# Patient Record
Sex: Male | Born: 1935 | Race: White | Hispanic: No | Smoking: Former smoker
Health system: Southern US, Community
[De-identification: ages and names within clinical notes are randomized; demographics above are authoritative.]

## PROBLEM LIST (undated history)

## (undated) DIAGNOSIS — I2510 Atherosclerotic heart disease of native coronary artery without angina pectoris: Secondary | ICD-10-CM

## (undated) DIAGNOSIS — I219 Acute myocardial infarction, unspecified: Secondary | ICD-10-CM

## (undated) DIAGNOSIS — K861 Other chronic pancreatitis: Secondary | ICD-10-CM

## (undated) DIAGNOSIS — R0602 Shortness of breath: Secondary | ICD-10-CM

## (undated) DIAGNOSIS — I10 Essential (primary) hypertension: Secondary | ICD-10-CM

## (undated) HISTORY — PX: ABLATION OF DYSRHYTHMIC FOCUS: SHX254

## (undated) HISTORY — DX: Other chronic pancreatitis: K86.1

## (undated) HISTORY — PX: BACK SURGERY: SHX140

---

## 2001-02-26 ENCOUNTER — Encounter: Admission: RE | Admit: 2001-02-26 | Discharge: 2001-05-27 | Payer: Self-pay | Admitting: Cardiology

## 2003-03-19 HISTORY — PX: CORONARY ARTERY BYPASS GRAFT: SHX141

## 2003-07-25 ENCOUNTER — Inpatient Hospital Stay (HOSPITAL_COMMUNITY): Admission: EM | Admit: 2003-07-25 | Discharge: 2003-08-01 | Payer: Self-pay | Admitting: Emergency Medicine

## 2003-08-12 ENCOUNTER — Encounter: Admission: RE | Admit: 2003-08-12 | Discharge: 2003-08-12 | Payer: Self-pay | Admitting: Cardiothoracic Surgery

## 2003-09-12 ENCOUNTER — Encounter (HOSPITAL_COMMUNITY): Admission: RE | Admit: 2003-09-12 | Discharge: 2003-12-11 | Payer: Self-pay | Admitting: Cardiology

## 2003-11-24 ENCOUNTER — Encounter: Payer: Self-pay | Admitting: Gastroenterology

## 2004-01-02 ENCOUNTER — Inpatient Hospital Stay (HOSPITAL_COMMUNITY): Admission: EM | Admit: 2004-01-02 | Discharge: 2004-01-09 | Payer: Self-pay | Admitting: Emergency Medicine

## 2004-04-30 ENCOUNTER — Encounter (HOSPITAL_COMMUNITY): Admission: RE | Admit: 2004-04-30 | Discharge: 2004-07-29 | Payer: Self-pay | Admitting: Cardiology

## 2004-09-05 ENCOUNTER — Encounter: Admission: RE | Admit: 2004-09-05 | Discharge: 2004-09-05 | Payer: Self-pay | Admitting: Internal Medicine

## 2007-04-03 ENCOUNTER — Ambulatory Visit: Payer: Self-pay | Admitting: Internal Medicine

## 2007-04-24 ENCOUNTER — Inpatient Hospital Stay (HOSPITAL_COMMUNITY): Admission: EM | Admit: 2007-04-24 | Discharge: 2007-04-30 | Payer: Self-pay | Admitting: Emergency Medicine

## 2007-05-11 ENCOUNTER — Ambulatory Visit (HOSPITAL_COMMUNITY): Admission: RE | Admit: 2007-05-11 | Discharge: 2007-05-12 | Payer: Self-pay | Admitting: Internal Medicine

## 2007-05-11 ENCOUNTER — Encounter: Payer: Self-pay | Admitting: Internal Medicine

## 2007-05-11 ENCOUNTER — Ambulatory Visit: Payer: Self-pay | Admitting: Internal Medicine

## 2007-06-18 ENCOUNTER — Ambulatory Visit: Payer: Self-pay | Admitting: Internal Medicine

## 2007-06-30 ENCOUNTER — Encounter: Admission: RE | Admit: 2007-06-30 | Discharge: 2007-08-04 | Payer: Self-pay | Admitting: Orthopedic Surgery

## 2008-02-06 ENCOUNTER — Encounter: Payer: Self-pay | Admitting: Emergency Medicine

## 2008-02-06 ENCOUNTER — Observation Stay (HOSPITAL_COMMUNITY): Admission: EM | Admit: 2008-02-06 | Discharge: 2008-02-09 | Payer: Self-pay | Admitting: Cardiovascular Disease

## 2008-08-22 ENCOUNTER — Inpatient Hospital Stay (HOSPITAL_COMMUNITY): Admission: EM | Admit: 2008-08-22 | Discharge: 2008-08-26 | Payer: Self-pay | Admitting: Emergency Medicine

## 2008-08-22 ENCOUNTER — Encounter (INDEPENDENT_AMBULATORY_CARE_PROVIDER_SITE_OTHER): Payer: Self-pay | Admitting: *Deleted

## 2008-09-04 DIAGNOSIS — K869 Disease of pancreas, unspecified: Secondary | ICD-10-CM | POA: Insufficient documentation

## 2008-09-12 ENCOUNTER — Ambulatory Visit (HOSPITAL_COMMUNITY): Admission: RE | Admit: 2008-09-12 | Discharge: 2008-09-12 | Payer: Self-pay | Admitting: General Surgery

## 2008-10-24 ENCOUNTER — Encounter: Payer: Self-pay | Admitting: Gastroenterology

## 2008-11-01 ENCOUNTER — Ambulatory Visit (HOSPITAL_COMMUNITY): Admission: RE | Admit: 2008-11-01 | Discharge: 2008-11-01 | Payer: Self-pay | Admitting: General Surgery

## 2008-11-01 ENCOUNTER — Encounter (INDEPENDENT_AMBULATORY_CARE_PROVIDER_SITE_OTHER): Payer: Self-pay | Admitting: *Deleted

## 2008-11-01 ENCOUNTER — Encounter (INDEPENDENT_AMBULATORY_CARE_PROVIDER_SITE_OTHER): Payer: Self-pay | Admitting: General Surgery

## 2008-11-29 DIAGNOSIS — I10 Essential (primary) hypertension: Secondary | ICD-10-CM | POA: Insufficient documentation

## 2008-11-29 DIAGNOSIS — K449 Diaphragmatic hernia without obstruction or gangrene: Secondary | ICD-10-CM | POA: Insufficient documentation

## 2008-11-29 DIAGNOSIS — K8020 Calculus of gallbladder without cholecystitis without obstruction: Secondary | ICD-10-CM | POA: Insufficient documentation

## 2008-11-29 DIAGNOSIS — I2510 Atherosclerotic heart disease of native coronary artery without angina pectoris: Secondary | ICD-10-CM | POA: Insufficient documentation

## 2008-11-29 DIAGNOSIS — E785 Hyperlipidemia, unspecified: Secondary | ICD-10-CM

## 2008-12-05 ENCOUNTER — Ambulatory Visit: Payer: Self-pay | Admitting: Gastroenterology

## 2008-12-06 ENCOUNTER — Encounter: Payer: Self-pay | Admitting: Gastroenterology

## 2008-12-06 ENCOUNTER — Ambulatory Visit: Payer: Self-pay | Admitting: Gastroenterology

## 2008-12-08 ENCOUNTER — Encounter: Payer: Self-pay | Admitting: Gastroenterology

## 2009-01-16 ENCOUNTER — Emergency Department (HOSPITAL_COMMUNITY): Admission: EM | Admit: 2009-01-16 | Discharge: 2009-01-16 | Payer: Self-pay | Admitting: Emergency Medicine

## 2010-06-23 LAB — TYPE AND SCREEN
ABO/RH(D): O POS
Antibody Screen: NEGATIVE

## 2010-06-23 LAB — CBC
HCT: 39.8 % (ref 39.0–52.0)
Hemoglobin: 13.2 g/dL (ref 13.0–17.0)
RBC: 4.08 MIL/uL — ABNORMAL LOW (ref 4.22–5.81)

## 2010-06-23 LAB — HEPATIC FUNCTION PANEL
ALT: 16 U/L (ref 0–53)
AST: 28 U/L (ref 0–37)
Albumin: 4.2 g/dL (ref 3.5–5.2)
Alkaline Phosphatase: 68 U/L (ref 39–117)
Bilirubin, Direct: 0.2 mg/dL (ref 0.0–0.3)
Total Bilirubin: 1.2 mg/dL (ref 0.3–1.2)

## 2010-06-23 LAB — BASIC METABOLIC PANEL
Calcium: 9.8 mg/dL (ref 8.4–10.5)
GFR calc Af Amer: >60 mL/min (ref >60)
GFR calc non Af Amer: >60 mL/min (ref >60)
Glucose, Bld: 94 mg/dL (ref 70–99)
Potassium: 4.2 mEq/L (ref 3.5–5.1)
Sodium: 141 mEq/L (ref 135–145)

## 2010-06-25 LAB — COMPREHENSIVE METABOLIC PANEL
ALT: 18 U/L (ref 0–53)
ALT: 22 U/L (ref 0–53)
AST: 23 U/L (ref 0–37)
AST: 34 U/L (ref 0–37)
Albumin: 3 g/dL — ABNORMAL LOW (ref 3.5–5.2)
Alkaline Phosphatase: 101 U/L (ref 39–117)
Alkaline Phosphatase: 123 U/L — ABNORMAL HIGH (ref 39–117)
Alkaline Phosphatase: 89 U/L (ref 39–117)
BUN: 27 mg/dL — ABNORMAL HIGH (ref 6–23)
BUN: 31 mg/dL — ABNORMAL HIGH (ref 6–23)
CO2: 26 mEq/L (ref 19–32)
CO2: 32 mEq/L (ref 19–32)
Calcium: 8.5 mg/dL (ref 8.4–10.5)
Chloride: 100 mEq/L (ref 96–112)
Chloride: 102 mEq/L (ref 96–112)
GFR calc Af Amer: >60 mL/min (ref >60)
GFR calc Af Amer: >60 mL/min (ref >60)
GFR calc non Af Amer: >60 mL/min (ref >60)
GFR calc non Af Amer: >60 mL/min (ref >60)
Glucose, Bld: 113 mg/dL — ABNORMAL HIGH (ref 70–99)
Glucose, Bld: 130 mg/dL — ABNORMAL HIGH (ref 70–99)
Potassium: 3 mEq/L — ABNORMAL LOW (ref 3.5–5.1)
Potassium: 3.9 mEq/L (ref 3.5–5.1)
Potassium: 4.5 mEq/L (ref 3.5–5.1)
Sodium: 136 mEq/L (ref 135–145)
Sodium: 137 mEq/L (ref 135–145)
Total Bilirubin: 2.2 mg/dL — ABNORMAL HIGH (ref 0.3–1.2)
Total Bilirubin: 2.3 mg/dL — ABNORMAL HIGH (ref 0.3–1.2)
Total Protein: 6.8 g/dL (ref 6.0–8.3)

## 2010-06-25 LAB — POCT I-STAT, CHEM 8
BUN: 37 mg/dL — ABNORMAL HIGH (ref 6–23)
Calcium, Ion: 1.11 mmol/L — ABNORMAL LOW (ref 1.12–1.32)
Chloride: 100 mEq/L (ref 96–112)
Creatinine, Ser: 1.4 mg/dL (ref 0.4–1.5)
Glucose, Bld: 128 mg/dL — ABNORMAL HIGH (ref 70–99)
HCT: 42 % (ref 39.0–52.0)
Hemoglobin: 14.3 g/dL (ref 13.0–17.0)
Potassium: 4.4 mEq/L (ref 3.5–5.1)
Sodium: 137 mEq/L (ref 135–145)
TCO2: 30 mmol/L (ref 0–100)

## 2010-06-25 LAB — POCT CARDIAC MARKERS
CKMB, poc: <1 ng/mL — ABNORMAL LOW (ref 1.0–8.0)
Myoglobin, poc: 92.4 ng/mL (ref 12–200)
Troponin i, poc: <0.05 ng/mL (ref 0.00–0.09)

## 2010-06-25 LAB — URINE MICROSCOPIC-ADD ON

## 2010-06-25 LAB — URINALYSIS, ROUTINE W REFLEX MICROSCOPIC
Glucose, UA: NEGATIVE mg/dL
Hgb urine dipstick: NEGATIVE
Ketones, ur: 15 mg/dL — AB
Ketones, ur: NEGATIVE mg/dL
Leukocytes, UA: NEGATIVE
Nitrite: NEGATIVE
Nitrite: POSITIVE — AB
Protein, ur: 100 mg/dL — AB
Protein, ur: 100 mg/dL — AB
Specific Gravity, Urine: 1.027 (ref 1.005–1.030)
Urobilinogen, UA: 1 mg/dL (ref 0.0–1.0)
Urobilinogen, UA: 4 mg/dL — ABNORMAL HIGH (ref 0.0–1.0)
pH: 5.5 (ref 5.0–8.0)

## 2010-06-25 LAB — HEMOGLOBIN AND HEMATOCRIT, BLOOD: HCT: 35.4 % — ABNORMAL LOW (ref 39.0–52.0)

## 2010-06-25 LAB — APTT
aPTT: 36 seconds (ref 24–37)
aPTT: 34 seconds (ref 24–37)

## 2010-06-25 LAB — BASIC METABOLIC PANEL
BUN: 26 mg/dL — ABNORMAL HIGH (ref 6–23)
Calcium: 9.6 mg/dL (ref 8.4–10.5)
Chloride: 98 mEq/L (ref 96–112)
Creatinine, Ser: 1.24 mg/dL (ref 0.4–1.5)
GFR calc Af Amer: >60 mL/min (ref >60)
GFR calc non Af Amer: 53 mL/min — ABNORMAL LOW (ref >60)
GFR calc non Af Amer: 57 mL/min — ABNORMAL LOW (ref >60)
Glucose, Bld: 98 mg/dL (ref 70–99)
Potassium: 4.2 mEq/L (ref 3.5–5.1)
Sodium: 142 mEq/L (ref 135–145)

## 2010-06-25 LAB — CBC
HCT: 40.1 % (ref 39.0–52.0)
HCT: 40.4 % (ref 39.0–52.0)
Hemoglobin: 13.8 g/dL (ref 13.0–17.0)
MCHC: 34 g/dL (ref 30.0–36.0)
MCV: 98.4 fL (ref 78.0–100.0)
Platelets: 118 10*3/uL — ABNORMAL LOW (ref 150–400)
RBC: 4.14 MIL/uL — ABNORMAL LOW (ref 4.22–5.81)
RDW: 14 % (ref 11.5–15.5)
RDW: 14.4 % (ref 11.5–15.5)
WBC: 14.6 10*3/uL — ABNORMAL HIGH (ref 4.0–10.5)
WBC: 16.4 10*3/uL — ABNORMAL HIGH (ref 4.0–10.5)

## 2010-06-25 LAB — DIFFERENTIAL
Basophils Absolute: 0 10*3/uL (ref 0.0–0.1)
Basophils Absolute: 0.1 10*3/uL (ref 0.0–0.1)
Basophils Relative: 0 % (ref 0–1)
Eosinophils Absolute: 0 10*3/uL (ref 0.0–0.7)
Eosinophils Relative: 0 % (ref 0–5)
Neutro Abs: 13.7 10*3/uL — ABNORMAL HIGH (ref 1.7–7.7)
Neutrophils Relative %: 84 % — ABNORMAL HIGH (ref 43–77)

## 2010-06-25 LAB — BILIRUBIN, FRACTIONATED(TOT/DIR/INDIR)
Bilirubin, Direct: 2.2 mg/dL — ABNORMAL HIGH (ref 0.0–0.3)
Indirect Bilirubin: 2.3 mg/dL — ABNORMAL HIGH (ref 0.3–0.9)
Total Bilirubin: 4.5 mg/dL — ABNORMAL HIGH (ref 0.3–1.2)

## 2010-06-25 LAB — MAGNESIUM: Magnesium: 2.2 mg/dL (ref 1.5–2.5)

## 2010-06-25 LAB — PROTIME-INR
INR: 1.2 (ref 0.00–1.49)
INR: 1.2 (ref 0.00–1.49)
Prothrombin Time: 15.1 seconds (ref 11.6–15.2)
Prothrombin Time: 15.7 s — ABNORMAL HIGH (ref 11.6–15.2)

## 2010-06-25 LAB — CULTURE, BLOOD (ROUTINE X 2): Culture: NO GROWTH

## 2010-06-25 LAB — HEPATIC FUNCTION PANEL
AST: 25 U/L (ref 0–37)
Albumin: 3.6 g/dL (ref 3.5–5.2)
Bilirubin, Direct: 0.3 mg/dL (ref 0.0–0.3)
Total Protein: 7 g/dL (ref 6.0–8.3)

## 2010-06-25 LAB — LIPID PANEL
Cholesterol: 114 mg/dL (ref 0–200)
Total CHOL/HDL Ratio: 2.9 RATIO
VLDL: 9 mg/dL (ref 0–40)

## 2010-06-25 LAB — LIPASE, BLOOD
Lipase: 119 U/L — ABNORMAL HIGH (ref 11–59)
Lipase: 36 U/L (ref 11–59)

## 2010-06-25 LAB — ABO/RH: ABO/RH(D): O POS

## 2010-06-25 LAB — BILIRUBIN, TOTAL: Total Bilirubin: 4.1 mg/dL — ABNORMAL HIGH (ref 0.3–1.2)

## 2010-06-25 LAB — BILIRUBIN, DIRECT: Bilirubin, Direct: 1.7 mg/dL — ABNORMAL HIGH (ref 0.0–0.3)

## 2010-07-31 NOTE — Op Note (Signed)
NAME:  Craig Parks, Craig Parks NO.:  [id number]   MEDICAL RECORD NO.:  [id number]          PATIENT TYPE:  AMB   LOCATION:  DAY                          FACILITY:  [facility]   PHYSICIAN:  Almond Lint, MD       DATE OF BIRTH:  15-Dec-1929   DATE OF PROCEDURE:  11/01/2008  DATE OF DISCHARGE:                               OPERATIVE REPORT   PREOPERATIVE DIAGNOSIS:  Gallstone pancreatitis.   POSTOPERATIVE DIAGNOSIS:  Gallstone pancreatitis.   PROCEDURE PERFORMED:  Laparoscopic cholecystectomy.   SURGEON:  Almond Lint, MD.   ANESTHESIA:  General and local.   FINDINGS:  Slightly inflamed gallbladder.   SPECIMEN:  Gallbladder to Pathology.   ESTIMATED BLOOD LOSS:  Minimal.   COMPLICATIONS:  None known.   PROCEDURE:  Craig Parks was identified in the holding area and taken to  the operating room where he was placed supine on the operating room  table.  General anesthesia was induced.  His abdomen was prepped and  draped in a sterile fashion.  A timeout was performed according to the  surgical safety checklist.  When all was correct, we continued.  The  infraumbilical skin was anesthetized with a mixture 1% lidocaine plain  and 0.25% Marcaine with epinephrine.  A curvilinear transverse incision  was made with a #11 blade below the umbilicus.  A Kelly clamp was used  to spread the subcutaneous tissues.  Two Kochers were used to elevate  the fascia in the midline.  The #11 blade was used to enter the fascia  in the midline.  A Tresa Endo was advanced into the peritoneal cavity and  dilated the fascial incision.  The fascial incision had a 0 Vicryl  pursestring suture placed around it.  The [facility] trocar was advanced  into the abdomen and secured to the abdominal wall with the tails of the  suture.  A pneumoperitoneum was achieved to a pressure of 15 mmHg.  The  patient was placed into reverse Trendelenburg position and rotated to  the left.  The epigastrium was identified and an  11-mm port was placed  under direct visualization after administration of local anesthetic.  Two 5-mm ports were placed under direct visualization in the right upper  quadrant.  A locking grasper was used to elevate the fundus of the  gallbladder toward the head.  An additional locking grasper was used to  retract the infundibulum and the gallbladder laterally.  A [place]  dissector was used to start to skeletonize the cystic duct and cystic  artery.  The artery was lying over the top of the cystic duct.  The hook  cautery was used to open up the peritoneum to facilitate the  skeletonization.  The artery was taken first after it was skeletonized  with 2 clips on the patient side and 1 on the specimen side.  This was  then transected.  The cystic duct was skeletonized.  Higher up in the  gallbladder, it began to taper down and so this was taken where it  tapered.  The cystic duct then had 3 clamps placed on the patient's  side  and 1 on the specimen side.  This was then transected.  The hook cautery  was then used to take the gallbladder off of the gallbladder fossa.  There was one area that appeared to be a small draining vein from the  gallbladder and this was clipped higher up.  The gallbladder was  completely removed and then placed into an EndoCatch bag and removed  through the umbilicus.  The gallbladder fossa was reexamined and there  was noted to be no bleeding and no bile leakage.  The area was irrigated  both above and below of the liver.  Again, the gallbladder fossa was  reinspected and there was no bleeding or bile leakage.  The 11-mm and 5-  mm ports were removed under direct visualization and there was no  evidence of bleeding.  The pneumoperitoneum was allowed to completely  evacuate through the umbilical port.  The trocar was then removed and  then the fascia was closed with a pursestring suture.  This was palpated  and there was no residual fascial defect identified.  The  skin of all of  the incisions was then closed with Monocryl 4-0 in a subcuticular  fashion.  The skin of all these was cleaned, dried, and dressed with  Dermabond.  The patient was awakened from anesthesia and taken to the  PACU in stable condition.      Almond Lint, MD  Electronically Signed     FB/MEDQ  D:  11/01/2008  T:  11/01/2008  Job:  [id number]

## 2010-07-31 NOTE — Op Note (Signed)
NAME:  CASSIUS, CULLINANE NO.:  [id number]   MEDICAL RECORD NO.:  [id number]          PATIENT TYPE:  INP   LOCATION:  5038                         FACILITY:  [facility]   PHYSICIAN:  Alvy Beal, MD    DATE OF BIRTH:  1929/09/29   DATE OF PROCEDURE:  04/25/2007  DATE OF DISCHARGE:                               OPERATIVE REPORT   CONTINUATION OF OPERATION:  Once I had an adequate decompression  completed from L2 to L5, I could easily pass the [place] 4 down the  lateral gutter.  I was also able take a Public house manager (hockey stick)  and pass it out the L2, L3 and L4 neural foramen bilaterally without any  significant undue tension.   At this point with the decompression complete, I took intraoperative x-  rays to confirm that I had adequately spanned the area of stenosis with  my decompression.  Once I was satisfied with the decompression, I  irrigated the wound copiously normal saline.  I then rechecked via  Valsalva  to ensure that there was no ongoing leak.  At this point I  then placed a drain into the deep wound and then placed FloSeal to help  maintain hemostasis.  I did use bipolar cautery to cauterize the  epidural veins and bleeders that I could visualize.  I then closed the  deep fascia with interrupted #1 Vicryl sutures, 2-0 Vicryl sutures for  the subcutaneous and staples for the skin.  A thick dry dressing was  applied.  He was extubated, transferred to PACU without incident.  The  end of the case all needle, sponge counts were correct.   FIRST ASSISTANT:  Crissie Reese, PA      Alvy Beal, MD  Electronically Signed     DDB/MEDQ  D:  04/25/2007  T:  04/27/2007  Job:  [id number]

## 2010-07-31 NOTE — Op Note (Signed)
NAME:  DAISON, Craig Parks NO.:  [id number]   MEDICAL RECORD NO.:  [id number]          PATIENT TYPE:  OIB   LOCATION:  3713                         FACILITY:  [facility]   PHYSICIAN:  Doylene Canning. Ladona Ridgel, MD    DATE OF BIRTH:  03-25-1929   DATE OF PROCEDURE:  DATE OF DISCHARGE:                               OPERATIVE REPORT   PROCEDURE PERFORMED:  Electrophysiologic study and radiofrequency  catheter ablation of atrial flutter.   INTRODUCTION:  The patient is a 77 year old male with a history of  typical palpitations and documented atrial flutter.  He also has  coronary disease, status post bypass surgery, and is now referred for  electrophysiologic study and catheter ablation.  Of note, the patient's  INR was subtherapeutic and so he has undergone TEE, which demonstrated  no left atrial appendage thrombus.  He is now presents for ablation.   PROCEDURE:  After informed was obtained, the patient was taken to the  diagnostic EP lab in a fasting state.  After the usual preparation and  draping, intravenous fentanyl and midazolam were given for sedation.  A  6-French hexapolar catheter was inserted percutaneously in the right  jugular vein and advanced to the coronary sinus.  A 7-French 20-pole  halo catheter was inserted percutaneously in the right femoral vein and  advanced to right atrium.  A 5-French quadripolar catheter was inserted  percutaneously in the right femoral vein and advanced to the His bundle  region.  These catheters were inserted under fluoroscopic guidance.  Mapping was carried out.  The atrial flutter was typical  counterclockwise tricuspid annular reentrant atrial flutter and the  atrial flutter isthmus was much larger than usual, being anteriorly  displaced.  A total of 14 RF energy applications were subsequently  delivered, resulting in the termination of flutter and restoration of  sinus rhythm.  The patient was observed and during this time he had  rapid ventricular pacing, demonstrating VA dissociation.  Programmed  ventricular stimulation demonstrated VA dissociation.  Rapid atrial  pacing was carried out from the coronary sinus in the high right atrium  and stepwise decreased to 620 msec, where AV Wenckebach was observed.  During rapid atrial pacing the PR interval was less than the RR interval  and there was no inducible SVT.  Programmed atrial stimulation was  carried out from the coronary sinus in the high right atrium at base  drive cycle length of 161 msec and stepwise decreased down to 610 msec,  where the AV node ERP was observed.  During programmed atrial motion  there were no AH jumps, no echo beats, no inducible SVT.  At this point  the catheters were removed, hemostasis was assured and the patient was  returned to his room in satisfactory condition.   COMPLICATIONS:  There were no immediate procedure complications.   RESULTS:  A.  Baseline ECG:  Baseline ECG demonstrates atrial flutter  with a controlled ventricular response.   B.  Baseline intervals:  The atrial flutter cycle length was 270 msec.  The HV interval was 45 msec.  QRS duration was 92  msec.   C.  Rapid ventricular pacing:  Rapid ventricular pacing was carried out  from the RV apex, demonstrating VA dissociation at 600 msec.   D.  Programmed ventricular stimulation:  Programmed ventricular  stimulation was carried out from the RV apex demonstrating VA  dissociation at 600 msec.   E.  Programmed atrial stimulation:  Programmed atrial stimulation was  carried out from the coronary sinus in the high right atrium at a base  drive cycle of 562 msec.  The S1-S2 interval was stepwise decreased down  to 610 msec, the where AV node ERP was observed.  During programmed  atrial stimulation there were no AH jumps, no echo beats.   F.  Rapid atrial pacing:  Rapid atrial pacing was carried out from the  coronary sinus in the high right atrium demonstrating AV  Wenckebach at  620 msec.  During rapid atrial pacing there was no inducible SVT.   G.  Arrhythmias observed:  Atrial flutter.  Initiation:  Present at the  time of EP study.  Duration was sustained.  Cycle length:  270 msec.  Method of termination was with catheter ablation.   H.  Mapping:  Mapping of the atrial flutter isthmus demonstrated an  unusually large atrial flutter isthmus.   1. RF energy application:  A total of 14 RF energy applications were      delivered resulting in the termination of flutter and restoration      of sinus rhythm, creation of bidirectional block in the atrial      flutter isthmus.   CONCLUSION:  This study demonstrates successful electrophysiologic study  and radiofrequency catheter ablation of typical atrial flutter with a  total of 14 radiofrequency energy applications delivered, resulting  termination of flutter, restoration of sinus rhythm, and creation of  bidirectional block in the atrial flutter isthmus.      Doylene Canning. Ladona Ridgel, MD  Electronically Signed     GWT/MEDQ  D:  05/11/2007  T:  05/12/2007  Job:  [id number]   cc:   Cristy Hilts. Jacinto Halim, MD

## 2010-07-31 NOTE — Consult Note (Signed)
NAME:  Craig Parks, Craig Parks NO.:  [id number]   MEDICAL RECORD NO.:  [id number]          PATIENT TYPE:  INP   LOCATION:  5038                         FACILITY:  [facility]   PHYSICIAN:  Alvy Beal, MD    DATE OF BIRTH:  [birth year]   DATE OF CONSULTATION:  DATE OF DISCHARGE:                                 CONSULTATION   REASON FOR CONSULTATION:  Acute foot drop.   HISTORY:  Craig Parks is a very pleasant 77 year old very active gentleman  who on February 4 was lifting some heavy objects and felt a pop in his  low back. He noted immediate underlying back pain and severe left leg  pain and a foot drop.  Because of the ongoing increasing symptoms, he  presents to the emergency room on April 24, 2007, and was admitted.  My partner, Dr. Ranell Patrick, was initially consulted and he notified me and  now assuming care for his spine issues.   At this point, the patient's overall pain is slightly improved with the  morphine and Decadron, but he still has a significant foot drop.  He  states that 30+ years ago he had a lumbar decompression 4-5. 5-1, and he  has done exceptionally well and now only his big complaint recently is  just some mild back pain.  However, ever since February 4, he has had  horrific leg pain.  He has been unable to move the foot.   He denies any history of incontinence of bowel and bladder.   Past medical, surgical, family, social history includes chronic a fib on  adequate rate control, chronic anticoagulation with Coumadin, coronary  artery disease status post CABG, hypertension, hypercholesterolemia and  diabetes.  Medications and allergies and past medical history are  outlined in Dr. Ranell Patrick' admission H&P from April 24, 2007.  There have  been no changes, and I have reviewed the clinical exam.   PHYSICAL EXAMINATION:  GENERAL:  Pleasant gentleman appears his stated  age in no acute distress.  He is alert and oriented x3.  NEUROLOGICAL:  He has downgoing  toes on Babinski testing.  No clonus.  Symmetrical deep tendon reflexes, 1+ bilaterally.  Deep tendon reflexes  are 1+ symmetrical bilaterally.  He has absent strength to the EHL,  tibialis anterior (0/5 on the left and about a +4/5 gastrocnemius on the  left, right side 5/5 throughout).  By report, normal rectal tone, normal  perianal sensation.  He has had negative nerve root tension sign.  He  had 5/5 strength in the upper extremity.  Negative Hoffman's sign.  He  has significant back pain with attempts at forward flexion in the bed.   STUDIES:  He does have an MRI which demonstrates a disk/scar tissue at  L3-4 causing both central and lateral recess stenosis, left side appears  to be worse than the right.  There is some scar tissue at 4-5 and 5-1  and some mild stenosis, but does appear to be canal limiting.   ASSESSMENT:  At this point time, the patient's INR is 1.6 and is being  reversed.  I  do think a myelogram would be in order to ensure that there  is no other area of significant compression and it is difficult to  determine whether or not he has scar tissue or any other areas of  compression, and I do think that surgical decompression is warranted.  It is  just a question of the extent of that decompression.  His most recent  INR is 1.6, and this was after vitamin K was given.  At this point, I am  awaiting a repeat INR so we can safely perform the myelogram and have a  better idea of how extensive the decompression needs to be.      Alvy Beal, MD  Electronically Signed     DDB/MEDQ  D:  04/25/2007  T:  04/27/2007  Job:  [id number]   cc:   Lorre Nick, M.D.

## 2010-07-31 NOTE — Assessment & Plan Note (Signed)
[facility]                         ELECTROPHYSIOLOGY OFFICE NOTE   Craig Parks, Craig Parks                     MRN:          [id number]  DATE:06/18/2007                            DOB:          18-Oct-1929    Craig Parks returns today for follow-up.  He is a very pleasant 77-year-  old male with a history of coronary disease and a history of atrial  flutter status post catheter ablation of his atrial flutter which was  carried out approximately six weeks ago.  He returns today for follow-up  and has done well.  He notes that very soon after his ablation he  immediately felt better.  He denies chest pain.  He continues on his  warfarin.   His medications besides warfarin include finasteride 5 mg a day, iron  325 two tablets a week, eplerenone 25 daily, Zocor 40 a day, aspirin 325  a day, folate and carvedilol 6.25 twice daily.   On exam today, he is a pleasant, well-appearing, elderly man in no  distress.  Blood pressure was 130/75, pulse 75 and regular, respirations  were 18.  Weight was 201 pounds.  NECK:  Revealed no jugular venous distention.  LUNGS:  Were clear bilaterally to auscultation.  No wheezes, rales, or  rhonchi are present.  CARDIOVASCULAR EXAM:  Revealed an irregular rhythm with normal S1-S2.  ABDOMINAL EXAM:  Was soft and nontender.  EXTREMITIES:  Demonstrated trace peripheral edema bilaterally.   His EKG demonstrates sinus rhythm with occasional PACs.   IMPRESSION:  1. Symptomatic atrial flutter.  2. Status post ablation.  3. COUMADIN therapy secondary to #1.  4. Known coronary disease.   DISCUSSION:  Overall, Craig Parks is stable.  He is maintaining sinus  rhythm very nicely.  I have asked that we discontinue his Coumadin today  and start full-strength aspirin.  I will see him back on a p.r.n. basis.     Doylene Canning. Ladona Ridgel, MD  Electronically Signed    GWT/MedQ  DD: 06/18/2007  DT: 06/18/2007  Job #: [id number]   cc:   Cristy Hilts.  Jacinto Halim, MD

## 2010-07-31 NOTE — Discharge Summary (Signed)
NAME:  Craig Parks, Craig Parks NO.:  [id number]   MEDICAL RECORD NO.:  [id number]          PATIENT TYPE:  INP   LOCATION:  6714                         FACILITY:  [facility]   PHYSICIAN:  Altha Harm, MDDATE OF BIRTH:  1930-02-28   DATE OF ADMISSION:  08/22/2008  DATE OF DISCHARGE:  08/26/2008                               DISCHARGE SUMMARY   DISCHARGE DISPOSITION:  Home.   DISCHARGE DIAGNOSES:  1. Biliary pancreatitis.  2. Cholelithiasis.  3. Hyperbilirubinemia suspected medication-related.  4. Hypertension.  5. Coronary artery disease status post coronary artery bypass graft.  6. Dyslipidemia.   DISCHARGE MEDICATIONS INCLUDE THE FOLLOWING:  1. Percocet 5/325 one to two tablets p.o. q.4 h. p.r.n.  2. Zocor 40 mg p.o. daily.  3. Folic acid 800 mcg p.o. daily.  4. Imdur 60 mg p.o. daily.  5. Coreg 12.5 mg daily b.i.d.  6. Aspirin 81 mg p.o. daily.  7. Triazolam 0.25 mg p.o. q.h.s.   DISCONTINUED MEDICATIONS:  1. Niacin 500 mg p.o. daily.  2. Aspirin 325 mg p.o. q.p.m.   CONSULTANTS:  1. [facility], Dr. Donell Beers.  2. Gastroenterology, Dr. Bosie Clos.   PROCEDURES:  None.   DIAGNOSTIC STUDIES:  1. Ultrasound of the abdomen done on admission which shows gallstones      and gallbladder sludge, negative for biliary obstruction.  2. CT of the abdomen without contrast done on admission which shows      findings consistent with acute pancreatitis.  There are no obvious      complicating features without contrast.   IMPRESSION:  1. Cholelithiasis.  2. Small bilateral pleural effusions and bibasilar atelectasis.  3. Coronary artery calcifications.  4. Hiatal hernia.  5. Advanced atherosclerotic changes involving the aorta without focal      aneurysm.   STUDIES:  1. One-view chest x-ray done on the 8th which shows congestive heart      failure.  2. HIDA scan done on the 8th which shows patent cystic duct and common      bile duct.  No evidence  for acute cholecystitis.   CODE STATUS:  Full code.   PRIMARY CARE PHYSICIAN:  Dr. Murray Hodgkins   ALLERGIES:  PLAVIX.   CHIEF COMPLAINT:  Abdominal pain and vomiting x4 days.   HISTORY OF PRESENT ILLNESS:  Please refer to the H and P dictated by Dr.  Brien Few on August 22, 2008, for details of the HPI.   HOSPITAL COURSE:  The patient was admitted and given bowel rest and  aggressive hydration.  His pancreatitis resolved and Surgery was  consulted for consideration of cholecystectomy in light of biliary  pancreatitis.  However, on the day of the consultation, the patient had  a rise in his bilirubin care up to peak of 4.5.  based upon the  patient's clinical presentation and results of studies, it was felt that  the hyperbilirubinemia was secondary to medication effect due to the  Primaxin.  The Primaxin was discontinued and the patient started to have  a decline in his bilirubin.  The last notable bilirubin level was 4.0.  There  is some question as to the timing of the cholecystectomy and the  patient has had a discussion with Surgery.  The decision has been made  that the cholecystectomy will occur on June 30th and the patient will  follow up with Dr. Donell Beers in the office in 2 weeks.  The patient was  started on his diet.  He was advanced to a low-fat, low-cholesterol diet  which he tolerated well without any difficulty.  There was some question  as to the patient's mobility.  He was evaluated by Physical Therapy and  Occupational Therapy.  Although their recommendations were for home  health, the patient had declined any home health therapies at this time.  The patient was ambulatory without oxygen with sats maintaining 93% to  95%.   RESTRICTIONS:  Physical:  None.  Dietary restrictions:  The patient  should be on a low-fat, low-cholesterol diet.   FOLLOWUP:  The patient is to follow up with Dr. Donell Beers at [facility] Surgery in 2 weeks in the office.  The phone number has  been  provided:  [phone].  The patient may call for an appointment.  The  patient is also to follow up with his primary care physician, Dr. Chilton Si,  as needed.   TOTAL TIME FOR DISCHARGE PROCESS:  40 minutes.      Altha Harm, MD  Electronically Signed     MAM/MEDQ  D:  08/26/2008  T:  08/26/2008  Job:  [id number]

## 2010-07-31 NOTE — Op Note (Signed)
NAME:  ANIL, HAVARD NO.:  [id number]   MEDICAL RECORD NO.:  [id number]          PATIENT TYPE:  INP   LOCATION:  5038                         FACILITY:  [facility]   PHYSICIAN:  Alvy Beal, MD    DATE OF BIRTH:  Jul 07, 1929   DATE OF PROCEDURE:  DATE OF DISCHARGE:                               OPERATIVE REPORT   PREOPERATIVE DIAGNOSIS:  Acute neurological deficit due to severe multi-  level lumbar spinal stenosis.   OPERATIVE PROCEDURE:  L2 to L5 decompression (revision decompression 4-  5)   SURGEON:  Dahari D. Shon Baton, M.D.   HISTORY:  This is a very pleasant 77 year old gentleman who presents  with a 4 to 10-day history of severe increasing back pain and horrific  left leg pain.  He presents to ER where an MRI was done which and  ultimately a myelogram was done, which demonstrated complete myelopathic  block and severe spinal stenosis, with hard disk osteophyte enfolded  bone spur at L3-L4.  After discussing treatment options, the patient  elected to proceed with surgery.  I reviewed all the risks, benefits,  and alternatives with the patient and his wife and consent was obtained.   ESTIMATED BLOOD LOSS:  About 350 mL.   COMPLICATIONS:  Dural tear that was directly repaired and then a dural  patch was placed over.   OPERATIVE NOTE:  The patient was brought to the operating room and  placed supine on the operating room table.  After successful induction  of general anesthesia endotracheal intubation, SCDs and Foley were  placed and the patient was turned prone onto a Wilson frame.  All bony  prominences were well-padded and the posterior spine was prepped and  draped.  The previous incision was re-incised and extended cranially.  Sharp dissection was carried out down through the deep fascia.  The [facility]  elevator was used to strip the soft tissue off the spinous process of L2  and L3 and L4, and a portion at L5 on the left side, which was the non-  operated side.  Once we had bilateral decompression at the level of the  facet joints, x-rays were taken to confirm the L3-L4 disk level.  At  this point, once we confirmed the L3-L4 space, I used a double-action  rongeur to resect the entire spinous process of L4 and L3.  there was  significant scar tissue and marked spinal cord compression.  I had to  use a high-speed bur to thin down the lamina in order to ultimately  develop a plane between the underlying ligamentum flavum and the lamina.  Then, using a combination of 2-mm and 3-mm Kerrison rongeurs, I  performed a central decompression of L3.  I then extended it out  performing a complete laminectomy of L3 bilaterally.  There was horrific  stenosis.  There was a marked thickened ligamentum flavum that had  buckled causing permanent indentation and thinning of the thecal sac.  I  then in order to safely do this I then went more proximal up and  performed a hemilaminotomy of L2 and was able  to get into the lateral  recess safely with a Penfield 4.  I then gently began dissecting the  thickened ligamentum flavum in the lateral recess away from the dura and  then resecting with a combination of 2-mm and 3-mm Kerrison rongeurs.  I  then worked my way down to the stenotic portion at L4.  There were some  disk fragments removed in the posterolateral gutter on the right side.  I then repeated this procedure on the contralateral side.  I then was  working my way inferiorly and noted a second area of significant  stenosis.  This was more behind the body of L4.  As such, I went to the  inferior aspect of the L4 lamina and again burred down the thickened  sclerotic bone and then used a curet to develop a plane and then a 2-mm  and 3-mm Kerrison to start my laminotomy.  I then performed a complete  laminectomy of L4.  At this point, while resecting the osteophytic  ridges, the thecal sac had become thinned and after resecting it, it  expanded and  went into a bone spike and a small dural tear was noted.  At this point, as I was decompressing, there were significant pulsations  returning to the thecal sac and expansion of the thecal sac.  Once I had  the complete lateral recess decompressed from L2 to the superior aspect  of L5 bilaterally, I then addressed the dural tear.  Using a 6-0 Prolene  stitch, I performed a direct repair.  I then performed a Valsalva to 40  and there was no ongoing CSF leak that I could appreciate anywhere at  the decompressive site.  With the repair completed, I then placed the  dural patch as an added measure of precaution.   Once I had an adequate decompression completed from L2 to L5, I could  easily pass the Achille 4 down the lateral gutter.  I was also able  take a Public house manager (hockey stick) and pass it out the L2, L3 and L4  neural foramen bilaterally without any hinderance..   At this point with the decompression complete, I took intraoperative x-  rays to confirm that I had adequately spanned the area of stenosis.  Once I was satisfied with the decompression, I irrigated the wound  copiously normal saline.  I then rechecked the dyral repair via Valsalva  to ensure that there was no ongoing leak.  At this point I then placed a  drain into the deep wound and then placed FloSeal to help maintain  hemostasis.  I did use bipolar cautery to cauterize the epidural veins  and bleeders that I could visualize.  I then closed the deep fascia with  interrupted #1 Vicryl sutures, 2-0 Vicryl sutures for the subcutaneous  and staples for the skin.  A thick dry dressing was applied.  He was  extubated, transferred to PACU without incident.  The end of the case  all needle, sponge counts were correct.   FIRST ASSISTANT:  Crissie Reese, PA   Dictation ended at this point.      Alvy Beal, MD  Electronically Signed     DDB/MEDQ  D:  04/25/2007  T:  04/27/2007  Job:  [id number]

## 2010-07-31 NOTE — Cardiovascular Report (Signed)
NAME:  Craig Parks.:  [id number]   MEDICAL RECORD NO.:  [id number]           PATIENT TYPE:   LOCATION:                                 FACILITY:   PHYSICIAN:  Nicki Guadalajara, M.D.     DATE OF BIRTH:  04-22-1929   DATE OF PROCEDURE:  02/08/2008  DATE OF DISCHARGE:                            CARDIAC CATHETERIZATION   [name] is a 77 year old gentleman who has known coronary  artery disease.  In May 2005, he underwent bypass surgery by Dr. Donata Clay x5 with LIMA graft to the LAD, vein to the first diagonal,  saphenous vein to the right coronary artery, and sequential saphenous  vein graft to the obtuse marginal 1 and obtuse marginal 2.  He had done  well until October 2005 when he had recurrent chest pain.  He was  admitted to the hospital and while in the emergency room developed a  VT/VF cardiac arrest and was successfully defibrillated.  ECG post  atrial fibrillation at that time showed ST-segment changes  anterolaterally, and he was taken emergently to the cardiac  catheterization laboratory by me on January 02, 2004.  Catheterization  revealed high-grade subtotal stenoses in his large sequential saphenous  vein graft to his OM-1 and OM-2 vessels which were successfully stented,  ultimately with a 3.5 x 32 mm TAXUS stent postdilated at 3.75 mm.  He  was also noted to have a probable old occlusion of his diagonal graft.  There was mild kinking of his LIMA graft without significant obstruction  and had minimal disease in his RCA graft.  He has subsequently done  exceptionally well.  This Saturday evening, February 06, 2008, he  developed recurrent symptomatology which reminded him of his symptoms  back in 2005.  He presented to the emergency room at [facility]  in [place] area.  He was treated for unstable angina, transported to  [facility].  Subsequent enzymes have been negative.  Definitive  cardiac catheterization is now  recommended.   PROCEDURE:  After premedication with Valium 5 mg intravenously, the  patient was prepped and draped in the usual fashion.  His right femoral  artery was punctured anteriorly, and a 5-French sheath was inserted.  Diagnostic catheterization was done utilizing 5-French Judkins 4 left  and right catheters.  Ultimately, the right catheter was used for  selective angiography into 2 of the vein grafts but a right bypass graft  catheter was necessary for selective angiography into the vein graft  supplying the right coronary artery.  Ultimately, a LIMA catheter was  necessary for angiography into the left internal mammary artery.  A 5-  French pigtail catheter was used for biplane cine left ventriculography  as well as distal aortography.  Hemostasis was obtained by direct manual  pressure.   HEMODYNAMIC DATA:  Central aortic pressure was 127/54.  Left ventricular  pressure 127/12.  Post A-wave 18.   ANGIOGRAPHIC DATA:  There was 70-80% ostial left main stenosis and 90%  stenosis in the distal left main extending into the ostium portion of  the  LAD.  The circumflex had a flush occlusion at the left main.   The LAD again had 90% ostial stenosis.  The first diagonal vessel had  80% ostial narrowing and then the inferior branch after its bifurcation  was small caliber and then occluded.  The second diagonal vessel was a  large-caliber vessel that had 80-90% ostial stenosis.  A flush and fill  phenomena was seen in the LAD due to competitive LIMA filling after 2  septal perforating arteries.   Again the circumflex coronary artery was totally occluded at its origin.   The right coronary artery was moderate size vessel that had luminal  narrowing, irregularity of 40% in its midsegment, 40-50% after the acute  margin and 80% distally before the PDA takeoff.  Before the PDA takeoff,  a flush and fill phenomena was seen distally.   The vein graft supplying the right coronary artery  was a large caliber  graft which anastomosed into the distal RCA.  The PDA was small caliber,  and there was now mid PDA lesion of approximately 80-90% which had  progressive 50-60% at the last catheterization from 2005.   A large sequential vein graft supplying the OM-1 and OM-2 vessel was  widely patent.  The previously placed stent in the proximal portion was  widely patent.  The graft supplied the OM-1 vessel which was smaller  caliber and OM-2 vessel.  There was extensive filling of the entire AV  groove circumflex back up to the point of total near ostial occlusion.   The vein graft supplying the diagonal vessel was again noted to be  occluded at its origin which is unchanged from the 2005 catheterization.   The left internal mammary artery was a tortuous vessel in its  midsegment.  There was no significant narrowing.  The LIMA graft  anastomosed into the mid LAD.   Biplane cine left ventriculography showed an ejection fraction of 50%  which has improved from 35% at the catheterization in October 2005.  There was now only a minimal small region of focal mid inferior  hypocontractility in the RAO projection and low posterolateral  hypocontractility in the LAO projection, which was markedly improved  from 5 years previously.   Distal aortography revealed widely patent renal arteries.  There was no  significant aortoiliac disease.   IMPRESSION:  1. Low normal left ventricular function (improved from 2005) with      minimal residual focal mid, distal, inferior hypocontractility and      low posterolateral hypocontractility.  2. Significant multivessel native coronary obstructive disease with 70-      80% ostial left main stenosis followed by 90% in the distal left      main extending into the ostium of the left anterior descending; 90%      ostial left anterior descending stenosis with 80% narrowing in the      first diagonal branch and 80-90% stenosis in the second diagonal       branch of the left anterior descending.  3. Total occlusion of the native circumflex.  4. A 40-50% lesions of irregularity in the mid right coronary artery      with 80% stenosis, just beyond the crux.  5. Patent vein graft supplying the distal right coronary artery with      evidence for 80-90% narrowing in a small posterior descending      artery vessel.  6. Widely patent stent in the proximal vein graft supplying  sequentially the obtuse marginal artery 1 and obtuse marginal      artery 2 vessel.  7. Patent left internal mammary artery to the left anterior      descending.  8. Old occluded saphenous vein graft to the diagonal vessel.   RECOMMENDATIONS:  Medical therapy.  The patient will also be started on  Ranexa.  He may ultimately be a candidate for EECP counterpulsation as  adjunctive to increased medical therapy.           ______________________________  Nicki Guadalajara, M.D.     TK/MEDQ  D:  02/08/2008  T:  02/09/2008  Job:  [id number]   cc:   Cristy Hilts. Jacinto Halim, MD  Kerin Perna, M.D.  Dr. Chilton Si

## 2010-07-31 NOTE — Discharge Summary (Signed)
NAME:  Craig Parks, Craig Parks NO.:  [id number]   MEDICAL RECORD NO.:  [id number]          PATIENT TYPE:  INP   LOCATION:  2921                         FACILITY:  [facility]   PHYSICIAN:  Cristy Hilts. Jacinto Halim, MD       DATE OF BIRTH:  [birth year]   DATE OF ADMISSION:  02/06/2008  DATE OF DISCHARGE:  02/09/2008                               DISCHARGE SUMMARY   DISCHARGE DIAGNOSES:  1. Unstable angina.  2. Known coronary artery disease with coronary artery bypass grafting      x5 in May 2005, catheterization this admission revealing distal      disease in the native right coronary artery with patent left      internal mammary artery to the left anterior descending and patent      vein graft to the first obtuse marginal artery and circumflex with      an ejection fraction of 50% with normal renal arteries, normal      iliacs.  3. History of an acute myocardial infarction in October 2005      complicated by ventricular tachycardia and ventricular fibrillation      arrest in the emergency room.  4. Prior left ventricular dysfunction, now ejection fraction of 50%.  5. Treated dyslipidemia.  6. Treated hypertension.  7. History of ablation of typical counterclockwise atrial flutter in      February 2009.   HOSPITAL COURSE:  Craig Parks is a 77 year old male followed by Dr. Chilton Si  and Dr. Jacinto Halim with history of coronary artery disease as noted above.  Recently, he had some chest pain, worrisome for unstable angina.  He was  admitted on February 05, 2008, for further evaluation.  Please see  admission history and physical for complete details.  Apparently, he has  had allergy to PLAVIX which causes hives.  The patient was started on  heparin and nitrates.  Enzymes were negative.  LDL was 84, HDL 44, and  cholesterol 140.  BNP 541.  Creatinine 1.35.  We set up for diagnostic  catheterization which was done on February 08, 2008.  This revealed  patent vein graft to the RCA with distal RCA  disease to be treated  medically.  The LIMA to the LAD was patent.  It appears that he has a  90% unprotected diagonal stenosis.  There was a vein graft to the OM and  circumflex that was patent and an old occluded vein graft to the  diagonal.  Plan is for medical therapy.  At discharge, the patient  admitted that he is now basically unable to afford his medications.  He  says the Inspra costs him more than $150.  He does not think he will be  able to afford Ranexa.  I suggested he try the Imdur which is new.  If  he does okay with this, we will step with Imdur.  If he continues to  have chest pain, we will need to consider Ranexa or possibly the EECP.  I also suggested  that if he has to sacrifice one of his other medicines that he could  stop taking the Inspra as opposed to stopping the aspirin, beta-blocker,  and statin.  His wife was there and she understands this as well.  We  feel he can be discharged on February 09, 2008, and he will follow up  with Dr. Jacinto Halim.      Abelino Derrick, P.A.      Cristy Hilts. Jacinto Halim, MD  Electronically Signed    LKK/MEDQ  D:  02/09/2008  T:  02/10/2008  Job:  [id number]   cc:   Cristy Hilts. Jacinto Halim, MD  Erskine Speed, M.D.

## 2010-07-31 NOTE — Letter (Signed)
April 03, 2007    Cristy Hilts. Jacinto Halim, MD  [address]  [place], [place] [address]   RE:  Craig, SCHOOLS  MRN:  [id number]  /  DOB:  1929-06-11   Dear Vonna Kotyk:   Thank you for referring [name] for EP evaluation and  consideration for catheter ablation of atrial flutter.  As you know, he  is a very pleasant 77 year old male with known coronary disease status  post bypass surgery, who sustained a VF arrest and acute myocardial  infarction back in 2007.  He underwent successful stenting to his vein  graft.  He has mild to moderate LV dysfunction with EF of about 45%.  He  notes that over the last several weeks, he has had increasingly worse  fatigue and weakness, particularly when he tries to walk.  His  activities have been limited somewhat in that he has severe arthritis in  his back which limits him somewhat.  The patient denies any history of  additional syncope since his intervention.  He denies chest pain.   MEDICATIONS:  1. Warfarin as directed.  2. Carvedilol 12.5 twice daily.  3. Finasteride 5 mg daily.  4. Iron supplements.  5. Zocor 40 a day.  6. Aspirin 325 a day.  7. Folate.   ADDITIONAL PAST MEDICAL HISTORY:  Notable for  1. Hypertension.  2. Dyslipidemia.  3. He has chronic arthritis, potentially in his lower back with some      mild leg weakness.   FAMILY HISTORY:  Notable for both parents being deceased.  His mother of  cancer in her 100s, and his father had a heart attack at age 46.   REVIEW OF SYSTEMS:  Notable for generalized fatigue which has been worse  the last few months.  He admits to erectile dysfunction.  He has mild  depression.  Otherwise all other systems reviewed and negative except as  noted in the HPI.   PHYSICAL EXAMINATION:  GENERAL APPEARANCE:  He is a pleasant well-  appearing 77 year old man in no acute distress.  VITAL SIGNS:  The blood pressure today was 179/100.  The pulse was 76  and regular, respirations were 18.   The weight was not recorded.  HEENT:  Normocephalic, atraumatic.  Pupils equal and round.  Oropharynx  moist.  Sclerae anicteric.  NECK:  No jugular distention.  There was no thyromegaly.  Trachea was  midline.  Carotids 2+ and symmetric.  LUNGS:  Clear bilaterally to auscultation.  No wheezes, rales or rhonchi  are present.  There is no increased work of breathing.  CARDIOVASCULAR:  A regular rate and rhythm with normal S1 and S2.  PMI  was minimally enlarged and laterally displaced.  ABDOMEN:  Soft, nontender, nondistended.  No organomegaly.  Bowel sounds  present.  No rebound or guarding.  EXTREMITIES:  No cyanosis, clubbing  or edema.  Pulses were 2+ and symmetric.  NEUROLOGIC:  Alert and oriented x3 with cranial nerves intact.  Strength  is 5/5 and symmetric.   EKG demonstrates atrial flutter with a controlled ventricular response.  This was 3 to 1 flutter.   IMPRESSION:  1. Symptomatic atrial flutter.  2. Coumadin therapy secondary to #1.  3. Coronary disease, status post bypass surgery, status post stenting      with moderate left ventricular dysfunction.   DISCUSSION:  I have discussed the treatment options with Craig Parks and  his wife who is with him today.  The risks, benefits,  goals and  expectations of electrophysiologic study and catheter ablation of atrial  flutter have been discussed with him and he wishes to proceed.   Vonna Kotyk, thank you again for referring Craig Parks for EP evaluation.    Sincerely,      Doylene Canning. Ladona Ridgel, MD  Electronically Signed    GWT/MedQ  DD: 04/03/2007  DT: 04/03/2007  Job #: [id number]

## 2010-07-31 NOTE — Discharge Summary (Signed)
NAME:  Craig Parks, Craig Parks NO.:  [id number]   MEDICAL RECORD NO.:  [id number]          PATIENT TYPE:  INP   LOCATION:  4740                         FACILITY:  [facility]   PHYSICIAN:  Alvy Beal, MD    DATE OF BIRTH:  12/03/1929   DATE OF ADMISSION:  04/24/2007  DATE OF DISCHARGE:  04/30/2007                               DISCHARGE SUMMARY   ADMISSION DIAGNOSIS:  Acute neurological deficit due to severe  multilevel lumbar spinal stenosis with acute footdrop.   DISCHARGE DIAGNOSIS:  Acute neurological deficit due to severe  multilevel lumbar spinal stenosis with acute footdrop.   PROCEDURE:  1. Spinal canal exploration 03.09.  2. Spinal structure repair 03.59.  3. L2-L5 lumbar decompression with revision of decompression at L4-L5.   BRIEF HISTORY:  Craig Parks is a very pleasant 77 year old very active  gentleman who on April 22, 2007, was lifting some heavy objects and  felt a pop at his low back.  He noted immediate underlying back pain and  severe left leg pain and a footdrop.  Because of his ongoing increasing  symptoms, he presented to the emergency room on April 24, 2007 and was  admitted to Dr. Shon Baton' partner Dr. Ranell Patrick, who was initially consulted  on the case.  Dr. Shon Baton took over care of the patient.  On April 25, 2007, patient's pain had slightly improved with the morphine and  Decadron therapy, but he still had a significant footdrop.  He states  that approximately 30 years ago, he had a lumbar decompression of L4-5  and L5-S1, and he has been exceptionally well, and only now his main  complaint recently is just a mild back pain.  However, ever since his  April 22, 2007, injury, he has had terrific leg pain and has been  unable to move his foot.  The patient denies any history of incontinence  of bowel or bladder.   PAST MEDICAL HISTORY:  Included a chronic atrial flutter with adequate  rate control and chronic anticoagulation with Coumadin,  coronary artery  disease, status post CABG, hypertension, hypercholesterolemia, and  diabetes.  After the initial consult, a CT myelogram was ordered to  determine whether or not he had any scar tissue right near the areas of  compression.  His INR initially upon admission to the hospital was 1.6  and vitamin K was given to lower his INR, so that he could safely  perform the myelogram.  The myelogram did demonstrate a complete  myelopathic block and severe spinal stenosis with a hard disk osteophyte  enfolded a bone spur at L3-4.  After discussing treatment options with  the patient, Dr. Shon Baton reviewed all the risks, benefits, and  alternatives to the patient and informed consent was obtained.   HOSPITAL COURSE:  77 year old length of stay at the hospital was 6 days  and on April 30, 2007, he was considered stable for discharge home.  The patient did have a history of atrial flutter and his primary  cardiologist, Dr. Jacinto Halim was notified of the patient's stay at the  hospital and his group  [facility] managed all of his cardiology issues while  he was an inpatient in the hospital.  From an orthopedic standpoint, the  patient tolerated the procedure very well.  He came out of the OR and  was transferred to the PACU in stable condition, and was transferred to  the tele floor with no complications.  Postoperatively, day 1, the  patient says he was feeling a lot better, and he was not complaining of  any epidural-type headaches.  His PCA was discontinued and he was  started on oral pain medications.  Postoperatively, day 2, he started  working with the physical therapy.  He was able to ambulate with  assistance.  The patient still had a footdrop, so ASO brace was ordered.  The patient was also restarted on his Coumadin therapy.  Throughout the  rest of the hospital stay, the patient's vital signs remained stable.  He eventually was able to void and have bladder functions on his own.  He was able  to tolerate a regular diet and was able to ambulate  approximately greater than 400 feet with assistance.  The patient also  regained approximately 4/5 strength in his left foot.   Discharge vital signs on April 30, 2007, temperature 97, pulse 99,  respirations 20, systolic blood pressure was 104, and diastolic pressure  was 51.   DISCHARGE LABORATORY DATA:  Included WBC is 9.0, RBC is 3.32, HGB of  11.4, HCT of 33.0, MCV of 99.4, MCHC of 34.4, RDW of 14.7 and PLT of  135.  His PT and INR upon discharge was, PT was 16.2 and his INR was  1.3.  Sodium (Na) 139, potassium (K) 5.1, Cl 102, CO2 was 30, glucose  was 144, BUN of 23, and creatinine was 1.05.   PERTINENT LABORATORY DIAGNOSTIC TESTING:  Radiographs on April 26, 2007, which were reviewed by Dr. Shon Baton demonstrated a resection of the  spinous processes of the lamina of L3-4 and portion of the spinous  process of L2 had been resected.   IMPRESSION:  Posterior decompression from L2 through L5.   DISCHARGE MEDICATIONS:  1. Percocet 5/325 one tablet every 4-6 hours as needed for pain.  2. Lyrica 75 mg p.o. b.i.d.  The patient was continued on all his home      current medications.   DISCHARGE INSTRUCTIONS:  Craig Parks was discharged home with home health  care.  He is to return to our office in 2 weeks for followup for suture  removal.  He is allowed to ambulate with assistance.  Continue to wear  his ASO brace as needed.  He cannot resume a regular diet.  He is to  also follow up with his cardiologist, Dr. Jacinto Halim in 2 weeks.  The patient  was instructed that he is allowed to shower; however, he is not allowed  to bathe, where he is soaking the wound.  He is allowed to ambulate as  much as he feels he can let pain be his guide.  The plan again is for  him to return to our office in 10-14 days for wound check.  At that  time, we will reassess the wound and remove the suture, and replace his  Steri-Strips.      Crissie Reese, PA      Alvy Beal, MD  Electronically Signed    AC/MEDQ  D:  06/23/2007  T:  06/24/2007  Job:  [id number]   cc:   Cristy Hilts. Jacinto Halim, MD

## 2010-07-31 NOTE — Discharge Summary (Signed)
NAME:  Craig Parks, AUKER NO.:  [id number]   MEDICAL RECORD NO.:  [id number]          PATIENT TYPE:  OIB   LOCATION:  3713                         FACILITY:  [facility]   PHYSICIAN:  Doylene Canning. Ladona Ridgel, MD    DATE OF BIRTH:  [birth year]   DATE OF ADMISSION:  05/11/2007  DATE OF DISCHARGE:  05/12/2007                               DISCHARGE SUMMARY   Dictation and examination greater than 40 minutes.   The patient has ALLERGY TO PLAVIX, WHICH CAUSES A RASH.   FINAL DIAGNOSES:  1. Admitted February 23 with subtherapeutic INR.  2. Transesophageal echocardiogram May 11, 2007.  No left atrial      appendage thrombus.  3. Electrophysiology study, radiofrequency catheter ablation May 11, 2007.  Ablation of a typical counterclockwise isthmus      dependent atrial flutter, Dr. Lewayne Bunting.  The patient had no      postprocedural complications.   SECONDARY DIAGNOSES:  1. Three-vessel coronary artery disease presenting Jul 21, 2003.  2. Status post coronary artery bypass graft surgery Jul 26, 2003.  3. Ejection fraction 55% to 60%, May 2005.  4. Repeat cath for angina January 02, 2004.  Ejection fraction 35%      with severe compromise to the saphenous vein graft to the obtuse      marginal, Taxus stent placed January 02, 2004.  5. The patient had a VT/VF arrest in the emergency room on admission      October 2005, requiring DC cardioversion and emergent      catheterization to follow with stent placement.  6. Diabetes.  7. Dyslipidemia.  8. Hypertension.  9. Strong family history of coronary artery disease.  10.Status post lumbar laminectomy.      a.     Recent hospitalization for left leg radiculopathy and foot       drop.      b.     L2, L5 decompression April 25, 2007, for multilevel lumbar       stenosis.   PROCEDURES:  1. May 11, 2007:  Transesophageal echocardiogram.  No left atrial      appendage thrombus.  2. May 11, 2007:   Electrophysiology study.  Radiofrequency      catheter ablation of a typical counterclockwise isthmus-dependent      atrial flutter, Dr. Lewayne Bunting.   BRIEF HISTORY:  Mr. Seehafer is a 77 year old male referred by Dr. Yates Decamp to Dr. Ladona Ridgel for electrophysiology evaluation and consideration  for catheter ablation of atrial flutter.   Mr. Mcmichen has known coronary artery disease.  He is status post  coronary artery bypass graft surgery.  He had a VT/VF arrest in October  2005, which required emergent catheterization and placement of a Taxus  stent to the saphenous vein graft to the OM.   He has mild-to-moderate left ventricular dysfunction with ejection  fraction running about 45%.  Over the last several weeks, the patient  has had increasing fatigue and weakness, particularly when he tries to  walk.  His activities have been limited somewhat since he has  arthritis  in his back and spinal stenosis.  He has had a recent surgery for  multilevel lumbar stenosis, April 25, 2007.   The patient noting generalized fatigue for the last few months.  His  electrocardiogram shows atrial flutter with controlled ventricular  response, a 3:1 flutter.  The patient will be admitted for  radiofrequency catheter ablation of this arrhythmia.  The risks,  benefits, goals and expectations have been described to the patient, and  he wishes to proceed.   HOSPITAL COURSE:  The patient presented February 23.  His INR was  subtherapeutic.  It was 1.6.  He was given Coumadin and underwent  transesophageal echocardiogram, which showed no left atrial appendage  thrombus.  He subsequently underwent the same day radiofrequency  catheter ablation of his atrial flutter.  The patient's INR on  postprocedure day #1 is now 1.8, still subtherapeutic.  The patient will  be given subcu Lovenox and an additional 4 mg prior to discharge with  early followup with Dr. Jacinto Halim for pro time.   The patient discharged on the  following medications.  They are:  1. Carvedilol 3.125 mg twice daily.  2. Zocor 40 mg daily at bedtime.  3. Enteric-coated aspirin 81 mg daily.  4. Inspra 12.5 mg daily at bedtime.  5. Coumadin 2 mg Monday, Wednesday, Thursday, Friday, Saturday and 4      mg Tuesday and Friday.  6. Folic acid 0.5 mg daily.  7. Fish oil to take as before this admission.   He has followup with [facility].  We will try  to  schedule for Thursday, February 26.  They will call him with that  appointment at [phone].  He has a followup with Dr. Ladona Ridgel on Thursday,  April 2 at noon.   LABORATORY STUDIES:  Once again, on the day of discharge, his protime is  21.2, INR 1.8.  His admission protime was 19, INR 1.6.  Basic metabolic  panel on the day of discharge:  Sodium was 140, potassium 4.2, chloride  105, carbonate 28, glucose was 91, BUN was 9, creatinine 0.96.  Complete  blood count this admission:  White cells 7.3, hemoglobin 12.7,  hematocrit 38.1 and platelets of 189.      Maple Mirza, PA      Doylene Canning. Ladona Ridgel, MD  Electronically Signed    GM/MEDQ  D:  05/12/2007  T:  05/12/2007  Job:  [id number]   cc:   Erskine Speed, M.D.  Cristy Hilts. Jacinto Halim, MD  Doylene Canning. Ladona Ridgel, MD

## 2010-07-31 NOTE — H&P (Signed)
NAME:  Craig Parks, KISHI NO.:  [id number]   MEDICAL RECORD NO.:  [id number]          PATIENT TYPE:  EMS   LOCATION:  MAJO                         FACILITY:  [facility]   PHYSICIAN:  Isidor Holts, M.D.  DATE OF BIRTH:  [birth year]   DATE OF ADMISSION:  08/22/2008  DATE OF DISCHARGE:                              HISTORY & PHYSICAL   PRIMARY CARE PHYSICIAN:  Lenon Curt. Chilton Si, M.D.   CARDIOLOGIST:  Cristy Hilts. Jacinto Halim, M.D.   ORTHOPEDIC SURGEON:  Dr. Venita Lick.   CHIEF COMPLAINT:  Upper abdominal pain and vomiting for 4 days.   HISTORY OF PRESENT ILLNESS:  This is a 77 year old male.  For past  medical history, see below.  According to the patient, he was quite well  until August 20, 2008, at about 10:00 a.m. when after a meal of cereal, he  developed sharp upper abdominal pain across the abdomen, radiating  around to the back.  He described his pain as very severe, constant, not  fluctuating in character, and was associated with vomiting several times  a day.  He states, up to about 20 times a day.  Initially, the vomitus  was bilious, although there were some episodes of dry heaving.  He  denies hematemesis or coffee-ground emesis.  Denies diarrhea, fever or  chills.  Over the next 3 days, he tried to call his primary MD, however,  got no response.  Finally on August 22, 2008, his spouse drove him to the  emergency department.   PAST MEDICAL HISTORY:  1..  Coronary artery disease, status post acute  MI October 2005, complicated by ventricular tachycardia/ventricular  fibrillation arrest.  .  1. Status post five-vessel CABG May 2005.  Had cardiac catheterization      November 2009, which showed native RCA disease but patent grafts,      ejection fraction 50% at that time.  .  2. Dyslipidemia.  3. Hypertension.  4. History of atrial flutter, status post ablation February 2009.  5. Multilevel lumbar degenerative joint disease, status post multiple      back surgeries,  complicated by left foot drop, status post L2-L5      decompression and revision of decompression at L4-L5 in February      2009.  6. Questionable history of diabetes mellitus.   MEDICATIONS:  1. Folic acid 800 mcg p.o. daily.  2. Imdur 60 mg p.o. daily.  3. Coreg 12.5 mg p.o. b.i.d.  4. Aspirin 81 mg p.o. q.a.m. and 325 mg p.o. q.p.m.  5. Niacin 500 mg p.o. daily.  6. Triazolam 0.25 mg p.o. at bedtime.   ALLERGIES:  NORVASC and PLAVIX.  These cause a rash.   REVIEW OF SYSTEMS:  As per HPI and chief complaint; otherwise negative.  The 10-point review of systems also negative.   SOCIAL HISTORY:  The patient is married, has two children and two  grandchildren.  He is fairly active, although he ambulates with a cane  and a left foot and ankle brace, secondary to left foot drop.  Ex-  smoker, quit approximately 1973.  Drinks alcohol about, three  to five  beers per day.  There is no history of drug abuse.   FAMILY HISTORY:  Noncontributory.   PHYSICAL EXAMINATION:  VITALS:  Temperature 98.3, pulse 68 per minute,  regular, respiratory rate 18, BP 159/75 mmHg, pulse oximeter 94% on room  air.  The patient did not appear to be in obvious acute distress after  intravenous Fentanyl administered in the emergency department.  Alert,  communicative, not short of breath at rest.  HEENT:  No clinical pallor or jaundice.  No conjunctival injection.  Throat is clear.  Visible mucous membranes appear dry.  NECK:  Supple.  JVP not seen.  No palpable lymphadenopathy.  No palpable  goiter.  CHEST:  Clinically clear to auscultation.  No wheezes or crackles.  HEART:  Heart sounds 1 and 2 are heard.  Normal, regular, no murmurs.  ABDOMEN:  Moderately obese.  Full, soft.  Patient has moderate to severe  tenderness in the epigastric region and right upper quadrant.  However,  has no positive Murphy's sign.  Bowel sounds are heard.  No other  abnormalities are noted.  EXTREMITIES:  No pitting edema.   Palpable peripheral pulses.  MUSCULOSKELETAL:  The musculoskeletal system not formally examined.  The  patient, however, does have osteoarthritic changes and left foot drop.  CENTRAL NERVOUS SYSTEM:  No focal neurologic deficits, apart from above  mentioned left foot drop.   LABORATORY DATA:  CBC:  WBC 16.4, hemoglobin 13.8, hematocrit 40.1,  platelets 117,000.  Electrolytes:  Sodium 140, potassium 4.5, chloride  102, CO2 28.  BUN 31, creatinine 1.3, glucose 130, alkaline phosphatase  89, AST 32, ALT 20, lipase 119, INR 1.2, PTT 34 seconds.  Troponin I at  point of care less than 0.05.  Urinalysis is negative with wbc's 0-2,  rbc's 0-2, bacteria few.  Abdominal ultrasound scan done August 22, 2008  showed gallstones with gallbladder, the largest measuring 15 mm.  There  was also sludge in the gallbladder.  Gallbladder wall was not thickened.  Gallbladder was distended.  Common bile duct was 4.8 mm.  Liver was  negative.  IVC negative. Pancreas negative, stain negative, right kidney  negative.  Left kidney negative.  Abdominal aorta negative..  12-lead  EKG done August 22, 2008 shows sinus rhythm, regular, 62 per minute.  Normal axis without acute ischemic changes.   ASSESSMENT AND PLAN:  1. Abdominal pain.  This is likely secondary to acute pancreatitis      clinically, although lipase is only mildly elevated.  We will      manage with bowel rest, proton pump inhibitor, analgesics,      antiemetics, and do abdominal CT scan to evaluate the pancreatic      anatomy.   1. Cholelithiasis.  This was confirmed by abdominal ultrasound.      Although the patient has no clinical Murphy's sign, gallbladder      appears distended.  The patient has no common bile duct dilatation      however, to suggest gallstone pancreatitis.  White cell count is      significantly elevated, although this may be secondary to #1 above.      We will cover with empiric Primaxin, arrange HIDA scan for August 23, 2008,  and consult surgeons.   1. Coronary artery disease.  The patient has no acute coronary      syndrome at present and no clinical congestive heart failure, but      he  does have a complex cardiac history.  We shall consult his      primary cardiologist at [facility], Dr.      Jacinto Halim, et al, to participate in care, in case invasive procedures      were required.   1. History of dyslipidemia.  We shall check lipid profile to rule out      hypertriglyceridemia.   1. Questionable history of diabetes mellitus.  Random blood glucose is      normal.  And the patient does not appear to be on oral hypoglycemic      agents, pre-admission. We shall check hemoglobin A1c for      completeness.   1. Hypertension.  The patient's BP is mildly elevated at present. As      he ishe is n.p.o, we shal utilize Clonidine patch for now.   Further management will depend on clinical course.      Isidor Holts, M.D.  Electronically Signed     CO/MEDQ  D:  08/22/2008  T:  08/22/2008  Job:  [id number]   cc:   Lenon Curt. Chilton Si, M.D.  Cristy Hilts. Jacinto Halim, MD  Dr. Levan Hurst

## 2010-07-31 NOTE — Consult Note (Signed)
NAME:  Craig Parks, Craig Parks NO.:  [id number]   MEDICAL RECORD NO.:  [id number]          PATIENT TYPE:  INP   LOCATION:  6714                         FACILITY:  [facility]   PHYSICIAN:  Craig Parks, MDDATE OF BIRTH:  [birth year]   DATE OF CONSULTATION:  08/24/2008  DATE OF DISCHARGE:                                 CONSULTATION   We were asked to see Craig Parks today in consultation for an elevated  bilirubin by [facility].   HISTORY OF PRESENT ILLNESS:  This is a 77 year old male with extensive  cardiac and orthopedic history, who was admitted with gallstone  pancreatitis.  His pain is improved and he is starting to take clear  liquids.  We were called for rising total bili despite all of his other  labs normalizing.  The patient reports no history of liver disease.  He  reports no new medications.  He states that he was on Norvasc that  caused a bad reaction in the form of a rash, but it was discontinued  months ago.  He has had no other new medications at home.  He has no  history of blood disorders.  The patient denies any NSAID use.  His LFTs  were noted to be normal on his last admission in November 2009.  The  patient is on Primaxin.  This could possibly cause a rise in his  bilirubin.   PAST MEDICAL HISTORY:  Significant for coronary artery disease.  He had  an MI in 2005 after a coronary artery bypass graft x5 vessels.  He has a  history of atrial flutter.  He is status post ablation in February 2009.  He has an extensive history of degenerative joint disease and status  post multiple back surgeries.  He has a history of hypertension and  hyperlipidemia.   CURRENT HOME MEDICATIONS:  Imdur, Coreg 325 mg, aspirin 81 mg,  triazolam, niacin, and folic acid.   ALLERGIES:  He has an allergy to PLAVIX and NORVASC.   REVIEW OF SYSTEMS:  He tells me that other than what was described in  the HPI, he feels good.  He has not had any specific weight  loss,  anorexia, shortness breath, or palpitations.  Just the pain from his  pancreatitis.   SOCIAL HISTORY:  Positive for about 6 beers a day.  He is an ex-smoker,  he quit in 1974.   FAMILY HISTORY:  Significant for the mother with gallbladder disease.  There is no liver disease or pancreatic disease in the family.   PHYSICAL EXAMINATION:  GENERAL:  He is alert and oriented, in no  apparent distress.  VITAL SIGNS:  Temperature 98.9, pulse 69, respirations 16, and blood  pressure is 143/77.  HEART:  Regular rate and rhythm.  LUNGS:  Clear.  ABDOMEN:  Soft, nontender, and nondistended with good bowel sounds.  The  patient demonstrates no jaundice or icterus.   LABORATORY DATA:  Significant for a total bilirubin of 4.2 today and 2.3  yesterday.  Other LFTs are within normal limits.  Hemoglobin 13.7,  hematocrit 40.4, white count  10.2, and platelets 118,000.  BMET is  significant for a potassium of 3.0 which is being repleted, BUN 27,  creatinine 1.20, serum albumin 3.0, PT 15.7, lipase 44, amylase 232  yesterday on August 23, 2008.  His lipase was 119 on admission.  On August 22, 2008, he had an abdominal ultrasound that showed gallstones and sludge.  His gallbladder was distended.  He had a CBD that 4.8 mm in diameter.  He had a CT of his abdomen and pelvis that showed an unremarkable liver,  moderate inflammation around the pancreas consistent with acute  pancreatitis.  On August 23, 2008, he had a HIDA scan that showed a patent  cystic and common bile duct.   ASSESSMENT:  Dr. Charlott Parks has seen and examined the patient,  collected a history, and reviewed his chart.  His impression is this is  a 77 year old gentleman with gallstone pancreatitis who does have heavy  alcohol use as well, on Primaxin with an isolated elevated bilirubin.   PLAN:  We will fractionate the bilirubin, consider possibly looking at a  blood smear, continue current care.  Further recommendations after  his  blood work has returned.  It could be that this is simply a reactive  bilirubin or could be due to medications here in the hospital such as  Primaxin. With a negative HIDA scan and U/S for CBD obstruction or  dilation, I do not think the hyperbilirubinemia is from a biliary stone.  Will D/C primaxin and follow LFTs.      Craig Parks, [place]      Craig Friar, MD  Electronically Signed    MLY/MEDQ  D:  08/24/2008  T:  08/25/2008  Job:  [id number]   cc:   Craig Parks, M.D.  Craig Hilts. Jacinto Halim, MD  Dr. Levan Parks

## 2010-07-31 NOTE — Consult Note (Signed)
NAME:  Craig Parks, [place] NO.:  [id number]   MEDICAL RECORD NO.:  [id number]          PATIENT TYPE:  INP   LOCATION:  6714                         FACILITY:  [facility]   PHYSICIAN:  Cherylynn Ridges, M.D.    DATE OF BIRTH:  03-11-1930   DATE OF CONSULTATION:  DATE OF DISCHARGE:                                 CONSULTATION   Thank you for asking me to see Mr. Kissling a 77 year old gentleman with  apparent symptomatic gallstones and possible biliary pancreatitis who  was admitted with abdominal pain in the right upper quadrant and  epigastrium.  The patient states that this started on Friday and  worsened throughout the weekend to where he came into the emergency room  because he was not improving.  I had significant nausea, vomiting  throughout the weekend to where he probably became dehydrated.  An  ultrasound showed gallstones and lipase was mildly elevated at 119.  Surgery was asked to see the patient.   His medical history is significant for significant cardiac disease.  He  is on multiple cardiac medications including Imdur, Coreg, and aspirin.  He is status post coronary artery bypass times five 5 years ago followed  by postoperative MI and then cardiac stents x2.  The patient continues  to have intermittent chest pain.  He will take nitroglycerin  sublingually, but takes a baby aspirin a day to help him with chest  pain.  He is allergic to [facility] and PLAVIX.   PAST SURGICAL HISTORY:  He has had bypass surgery.  He had a 7-hour back  surgery 2 to 2-1/2 years ago.   PHYSICAL EXAMINATION:  VITAL SIGNS:  On exam, he is afebrile.  Vital  signs are stable.  HEENT:  He is not jaundiced.  No scleral icterus.  NECK:  Supple.  No bruits.  CHEST:  Clear to auscultation.  CARDIAC:  Regular rhythm and rate with no murmurs.  ABDOMEN:  Soft with mild-to-moderate tenderness in the epigastrium and  right upper quadrant.  No surgical scars outside the chest tube scars in  the  epigastrium.  No rebound or guarding.  He is afebrile.   LABORATORY STUDIES:  He has a white count of 16,400, hemoglobin 13.8.  Electrolytes were within normal limits.  BUN of 31, creatinine of 1.3.  His total bilirubin is 2.2.  Lipase is 119, slightly elevated.  Ultrasound, no gallstones.   IMPRESSION:  Likely symptomatic gallstones.  Biliary colic or may have  passed the stone, which cause a mild pancreatic irritation.  However,  because of his significant cardiac history and also being on aspirin.  He presents some risk from surgery and he should be cleared by  Cardiology prior to going into surgery.  He was scheduled for a stress  test on the August 29, 2008, and some type of stress test should probably  be done prior to surgery.  We will hold his aspirin for now, but if they  need to be restarted, it will be done so by the Cardiology service.   RECOMMENDATIONS:  Cardiology consult and likely cardiac clearance before  surgery.  Hold his aspirin.  We will go ahead and take his gallbladder  out during this admission.      Cherylynn Ridges, M.D.  Electronically Signed     JOW/MEDQ  D:  08/22/2008  T:  08/23/2008  Job:  [id number]

## 2010-08-03 NOTE — Discharge Summary (Signed)
NAME:  Craig Parks, Craig Parks NO.:  [id number]   MEDICAL RECORD NO.:  [id number]                   PATIENT TYPE:  INP   LOCATION:  2008                                 FACILITY:  [facility]   PHYSICIAN:  Kerin Perna, M.D.               DATE OF BIRTH:  December 24, 1929   DATE OF ADMISSION:  DATE OF DISCHARGE:  08/01/2003                                 DISCHARGE SUMMARY   CARDIOLOGIST:  Dr. Cristy Hilts. Ganji.   ADMISSION DIAGNOSIS:  Unstable angina with non-Q wave myocardial infarction.   SECONDARY DIAGNOSES:  1. Class IV unstable angina with non-Q wave myocardial infarction.  2. Left main, three vessel coronary artery disease, status post coronary     artery bypass grafting.  3. History of heavy alcohol use.  4. Hyperlipidemia, started on Zocor 40 mg daily.  5. Newly diagnosed diabetes mellitus, started on Glucophage 500 mg twice     daily.  6. Hemoglobin A1c on Jul 26, 2003, was 5.0.  7. Hypertension.  8. Status post lumbar laminectomy x3.   SPECIAL PROCEDURES:  1. On Jul 27, 2003, he underwent coronary artery bypass grafting x5 using a     left internal mammary artery to the left anterior descending, saphenous     vein graft to the first diagonal, saphenous vein graft to the right     coronary, sequential saphenous vein graft to the obtuse marginal 1 and     obtuse marginal 2.  Surgeon was Dr. Kathlee Nations Tright.  Also, endoscopic     vein harvesting from the right leg.  2. On Jul 26, 2003, he underwent cardiac catheterization by Dr. Cristy Hilts.     Ganji.  Findings showed a normal left ventricular systolic function with     an ejection fraction of 55-60% with no significant mitral regurgitation,     heavily calcified coronary artery especially in the proximal segment.     The posterior descending had 60% stenosis, left main with eccentric 75-     80% distal stenosis which extended to the circumflex and left anterior     descending, ostial circumflex calcified 95% in  the mid and after origin     of OM1 to 90%, left anterior descending ostium had 90% stenosis, and     diagonal 1 had 70% stenosis at the ostium.  3. Carotid duplex on Jul 26, 2003, showed no significant internal carotid     artery stenosis bilaterally.  Vertebral flow was antegrade.  His     bilateral ABI's were normal at greater than 1.0.   ALLERGIES:  No known drug allergies.   BRIEF HISTORY:  Craig Parks is a 77 year old Caucasian male who presented to  the [facility] emergency department on Jul 25, 2003, with chest  pain.  Apparently a week prior he had experienced his first episode of chest  pain.  This is his third episode  of chest pain.  He describes the pain as  tightness.  There is also associated tingling in his bilateral arms.  He  denies diaphoresis.  He had slight shortness of breath, but no nausea or  vomiting.  An EKG was done in the emergency department which showed  nonspecific changes, and his troponin was mildly elevated at 0.6.  He was  placed on nitroglycerin and Integrilin, and his chest pain resolved.  It was  felt he should be admitted for further workup including cardiac  catheterization.   HOSPITAL COURSE:  On Jul 25, 2003, Craig Parks presented to the [facility] emergency department with chest pain.  He was later admitted on Jul 26, 2003, to undergo further workup.  He subsequently underwent cardiac  catheterization with results as discussed above.  Subsequently, he was  referred to Dr. Kathlee Nations Trigt for consideration of surgical  revascularization.   After exam of the patient and review of his cardiac catheterization report,  Dr. Morton Parks did in fact feel Craig Parks would benefit from coronary  artery bypass grafting, and after discussing the risks, benefits and  alternatives with the patient, he did agree to proceed.  Surgery was  scheduled the following day, Jul 27, 2003.  Specifics as discussed above.  He tolerated the surgery well,  and was transferred from the operating room  to the surgical intensive care unit in stable condition.   Later that evening, he had been extubated, neurologically intact.  He was  also hemodynamically stable.   Postoperative day #1, Craig Parks remained stable.  His heart was atrial  paced at 90.  His postoperative labs were stable.  On exam, his chest tube  was noted to have an air-leak, and he was also noted to have a pericardial  rub.  His air-leak was a left chest tube, was continued to suction; however,  his mediastinal tubes were discontinued secondary to minimal output.  During  this time, he was noted to have elevated blood sugars ranging from 140 to  188.  He was seen by the diabetes coordinator who felt he would benefit from  metformin as well as insulin sliding scale while he remained hospitalized.  In addition, secondary to his history of alcohol abuse, it was ordered that  he receive four ounces of whiskey twice a day to prevent DT's.  Valium was  also ordered as needed.   Over the next several days, Craig Parks continued to progress.  By  postoperative day #2, he was transferred out of the unit on to the floor.  He had fluid volume excess and was started on diuretic therapy.  His  external pacer was also removed, and he was in sinus rhythm.  His blood  pressure remained stable on metoprolol 25 mg three times a day, and Altace 5  mg a day.  He was also started on Zocor for hyperlipidemia.  He then  reported a decrease in appetite but was tolerating oral diet.  His bowel and  bladder functions were working appropriately.  His incisions were healing  well without signs of infection.  His pain was controlled on oral  medication.  His blood sugars were also tolerating his Glucophage regimen,  and were ranging between 110 and 177.  He was ambulating in the hallway with  cardiac rehabilitation.  However, initially he did have complaints of dizziness as well as fatigue.  This began  to improve.  He showed no evidence  of  DT's.  He remained neurologically appropriate.   By postoperative day #4, Craig Parks remained hemodynamically stable.  His  blood pressure was 124/58.  He was afebrile.  __________ is present.  His  blood sugars were well controlled.  His oxygen saturation was 96%.  He was  given two liters of supplemental oxygen via nasal cannula.  He was diuresing  well.  However, his legs continued to show trace edema, and it was felt that  he would benefit from a few additional days of diuretic therapy.  Lungs were  relatively clear.  However, his chest x-ray showed left base atelectasis  with small effusion.  Aggressive pulmonary toilet was encouraged.  By  recorded, it appears he continued to have intermittent pericardial rub.  Abdominal exam was benign.   The patient continued to progress in this manner, it is felt that he will be  ready for discharge on Aug 01, 2003.  Additional discharge orders however  will be pending, and that the patient has been weaned from supplemental  oxygen, and his follow up chest x-ray remains stable.  It will also depend  on Mr. Kocher's progression with cardiac rehabilitation to insure that his  dizziness has resolved, and his energy level continues to improve.   LABORATORY DATA:  On Jul 31, 2003, his white blood count was 8.9, hemoglobin  9.3, hematocrit 26.7, platelet count 205,000.  Sodium 136, potassium 3.8,  BUN 25, creatinine 1.3, blood glucose 97.   DISCHARGE MEDICATIONS:  1. Enteric-coated aspirin 325 mg, one p.o. daily.  2. Metoprolol 25 mg, one p.o. t.i.d.  3. Altace 5 mg one p.o. daily.  4. Zocor 40 mg one p.o. daily.  5. K-Dur 20 mEq one p.o. daily x7 days.  6. Lasix 40 one p.o. daily x7 days.  7. Folic acid, 1 mg p.o. daily.  8. Multivitamin one p.o. daily.  9. Glucophage 500 mg one p.o. b.i.d.  10.      Tylox one to two tablets patient q.4-6h. p.r.n. pain.   DISCHARGE ACTIVITY:  1. He is instructed to  avoid driving or heavy lifting more than 10 pounds.  2. He is to continue his daily walking and __________exercises.   DISCHARGE DIET:  1. Follow a low-fat, low-salt diet.  2. He is also instructed to monitor his carbohydrates secondary to his     diabetes.   WOUND CARE:  He may shower, clean his incisions with mild soap and water.   DISCHARGE INSTRUCTIONS:  He is to notify the [facility] office if he develops  fever, redness or drainage from his incision site.   FOLLOWUP:  1. He is to follow up with Dr. Kathlee Nations Trigt, at the [facility] office in     approximately three weeks.  The office will contact him with specific     date and time.  2. He is to follow up with Dr. Jacinto Halim in two weeks.  He is to call [phone]     to schedule this appointment.  He will have a chest x-ray done at this     time, and instructed to bring the chest x-ray films with him to his     appointment with Dr. Kathlee Nations Trigt. 3. Diabetes education, outpatient diabetic education will be arranged prior     to discharge.  He will also be instructed on self blood glucose     monitoring.      Jerold Coombe, P.A.  Kerin Perna, M.D.    AWZ/MEDQ  D:  07/31/2003  T:  07/31/2003  Job:  [id number]   cc:   Mikey Bussing, M.D.  [address]  [place]  [place] [address]   Cristy Hilts. Jacinto Halim, M.D.  [address]  [place]  [place] [address]  Fax: [phone]   Erskine Speed, M.D.  [address] [place]  [place]  [place] [address]  Fax: [phone]

## 2010-08-03 NOTE — Discharge Summary (Signed)
NAME:  Craig Parks, SEDER NO.:  [id number]   MEDICAL RECORD NO.:  [id number]          PATIENT TYPE:  INP   LOCATION:  3728                         FACILITY:  [facility]   PHYSICIAN:  Cristy Hilts. Jacinto Halim, MD       DATE OF BIRTH:  Mar 24, 1929   DATE OF ADMISSION:  01/02/2004  DATE OF DISCHARGE:  01/09/2004                                 DISCHARGE SUMMARY   ADMISSION DIAGNOSES:  1.  Unstable angina.  2.  History of coronary artery disease.      1.  History of non-Q wave myocardial infarction, May 2005.      2.  Status post catheterization, May 2005 and subsequent coronary artery          bypass grafting.      3.  Status post CABG, May 2005, by Dr. Kathlee Nations Trigt, with left          internal mammary artery to left anterior descending, saphenous vein          graft first diagonal, saphenous vein graft rule out right coronary          artery, sequential saphenous vein graft to obtuse marginal 1 and          obtuse marginal 2.      4.  Status post procedure nuclear study, July 2005, with anterior and          lateral ischemia.  3.  Ejection fraction normal.  4.  Hyperlipidemia.  5.  Diabetes mellitus, type 2.  6.  Hypertension.  7.  History of lumbar laminectomy x 3.  8.  History of alcohol use in the past.   DISCHARGE DIAGNOSES:  1.  Unstable angina.  2.  History of coronary artery disease.      1.  History of non-Q wave myocardial infarction, May 2005.      2.  Status post catheterization, May 2005 and subsequent coronary artery          bypass grafting.      3.  Status post CABG, May 2005, by Dr. Kathlee Nations Trigt, with left          internal mammary artery to left anterior descending, saphenous vein          graft first diagonal, saphenous vein graft rule out right coronary          artery, sequential saphenous vein graft to obtuse marginal 1 and          obtuse marginal 2.      4.  Status post procedure nuclear study, July 2005, with anterior and          lateral  ischemia.  3.  Ejection fraction normal.  4.  Hyperlipidemia.  5.  Diabetes mellitus, type 2.  6.  Hypertension.  7.  History of lumbar laminectomy x 3.  8.  History of alcohol use in the past.  9.  Status post ventricular tachycardia/VF arrest in the emergency room on      January 02, 2004, requiring defibrillation.  10. Status post urgent cardiac catheterization on January 02, 2004, by Dr.      Daphene Jaeger, with intervention to the saphenous vein graft to circumflex.  11. PLAVIX allergy.  Therefore treated with Ticlid instead of Plavix.  12. Coronary spasm at catheterization:  Treat with Imdur.  13. Depressed left ventricular function with ejection fraction 35% at      catheterization:  Medicines adjusted.  14. Desaturation:  Oxygen saturation is down to 80% with ambulation at the      time of discharge home.  He will be discharged home on oxygen 2 L per      nasal cannula which is new.  15. Bronchitis which is treated with Zithromax in the hospital.  16. Mild congestive heart failure/volume overload on January 07, 2004,      requiring IV Lasix.   HISTORY OF PRESENT ILLNESS:  Craig Parks is a 77 year old white married male.  In May 2005, he developed a non-Q wave MI and subsequently underwent cardiac  catheterization, revealing significant multivessel disease.  He ultimately  underwent bypass grafting by Dr. Kathlee Nations Trigt.  This included an LIMA to  LAD, SVG to first diagonal, SVG to RCA, a sequential SVG to OM 1 and OM 2.  He did well postprocedure and had a follow up Cardiolite scan, July 2005,  which was abnormal.  That revealed evidence of anterior and lateral ischemia  from the apex to the mid ventricle.  EF was 56%.  Dr. Jacinto Halim discussed the  test results with the patient at follow up.  At that point, the patient was  asymptomatic and was stable.  It was felt that no further testing was needed  at that point.  He will be advanced to cardiac rehab.   Since that time, he  continued to do well without any complaints at all until  the Saturday prior to his admission.  At that time, while lifting some  weights, he was doing some 40 pound curls.  He was in his 15th repetition  when he developed some mild chest discomfort.  He finished and then with  rest, the discomfort went away.  About 2 hours later, it returned more  significantly and had been there ever since and continued to be present  until his evaluation at our office.  He had been unable to sleep at night  and was having some mild shortness of breath, was increasing.   The pain was described as a tight pressure across his chest.  Occasionally  it would radiate towards his back.  He was feeling short of breath.  He had  no vomiting but had been very nauseated on the morning of admission with  saliva pooling in his mouth which started about 3:00 that morning.  He had  tried some nitroglycerin at home without any relief.  He had been using some  pole diggers recently which had increased his pain significantly.   When he came to the office, his EKG showed significant changes of ST  depression with inferolateral ischemia.  He was given aspirin in the office  and was transferred to [facility] Emergency Room via EMS for a cardiac cath  that day.  At that time, they had planned to place him on oxygen, have him  transferred by EMS.  He would be started on IV nitroglycerin, IV heparin,  and IV Integrilin as well as the aspirin that he chewed up in the office  prior to leaving there.  He was evaluated by Dr. Lenise Herald, who planned  for this as above.   HOSPITAL COURSE:  While in the emergency room on January 02, 2004, at about  12:40 p.m., he noted some chest pain and subsequently went into VT/VF arrest  and was defibrillated x 1.  He then converted to third-degree heart block  and then sinus rhythm ultimately.  EKG then showed new ST depression V4-V6 as well as I, II aVL.  Dr. Tresa Endo then saw the patient,  gave him Lopressor  2.5 mg IV x 2, and planned for emergent cath.  This was all discussed with  the patient and wife, and they were agreeable.   On January 02, 2004, Mr. [name] underwent cardiac catheterization by Dr. Daphene Jaeger.  Please see his dictated report for all details.  He ultimately  performed intervention using Taxus stenting to the SVG to circumflex.  The  patient tolerated the procedure well without complications.  Postprocedure,  it was noted that he had PLAVIX allergy, therefore was treated with Ticlid.  Please see the dictated report for all details with the cath procedure.  He  tolerated the procedure well without complications.   On January 03, 2004, he was without significant complaints.  It was noted  that he had had coronary spasm at cath, and he was treated with some low  dose Imdur.  Dr. Tresa Endo continued Integrilin for 36 hours postprocedure.   As well, it was noted that the patient's EF was 35% at cath.  We changed  Lopressor to Coreg.  It was noted that the low EF may have been from  stunning, but it felt that Coreg would be better as well given the patient's  spasms.   On January 05, 2004, Mr. Scheel felt very weak but no further chest pain.  His groin site remained stable.  He had had some low-grade fevers of about  100.2, and his chest x-ray showed possible infiltrates.  He subsequently was  started on Zithromax.   Over the next couple of days, medications were adjusted.  He tolerated this  without any complications.   On rounds on January 07, 2004, it was noted that the previous night oxygen  saturations had dropped down to the 70s.  At this point, he was seen by Dr.  Clarene Duke, who gave him increased Lasix.  On January 08, 2004, he further  increased the Lasix dosing.  He planned to obtain another x-ray.  Next day,  CXR showed no effusion, so would consider discharge home in the morning.   On January 09, 2004, Mr. Tinnon is doing well.  He says his  breathing is  much better.  His follow up chest x-ray showed small effusions which were  stable and decreased CHF.  His labs were stable.  He is stable with blood  pressure 96/64, heart rate 65.  He is at 99% on 2 L lying in the bed.  He is  afebrile at 97.6.  He does have rales at the bases.  Heart is regular rhythm  with 1/6 murmur.  There is no edema.   At this point, he was seen by Dr. Caprice Kluver.  He requested the patient be  ambulated again in the hallway with document of oxygen saturations.  Actually, with ambulation his oxygen saturations did drop to 80%; therefore,  he will need home O2 which will be set up prior to discharge home.   It was felt by Dr. Clarene Duke that we needed to give Lasix 80 mg b.i.d. x 2 days  then Lasix 80 mg a day.   We will follow up a BMET on this upcoming Friday prior to the patient seeing  Dr. Clarene Duke in the office for follow up evaluation.  At this point, he was seen and evaluated by Dr. Caprice Kluver, who has deemed  him stable for discharge home.   HOSPITAL CONSULTS:  None.   HOSPITAL PROCEDURES:  Cardiac catheterization by Dr. Daphene Jaeger, on January 02, 2004.  Please see his dictated report for all details and findings.  He  ultimately performed Taxus stenting to the SVT to circumflex.  EF was 35% at  time of cath.  Again, see the dictation for other findings.   Chest x-ray on the admission on the 17th showed CHF with probable small left  pleural effusion.  Chest x-ray on the 19th showed increase and asymmetric  infiltrates or edema.  Stable cardiomegaly and small left effusion.  October  20, chest x-ray showed CHF and small left pleural effusion without  significant change.  January 06, 2004, chest x-ray showed:  (1) Interval  improvement of pulmonary edema since yesterday, though moderate interstitial  and air space edema persists.  (2) Improving left effusion, though a  moderate effusion persists.  (3) No new abnormality.  Chest x-ray January 08, 2004, showed some improvement in edema pattern.  No change in small left  pleural effusion.   LABORATORY DATA:  Cardiac enzymes on admission show CK of 493, 1623; CK-MB  77.1, 199.4, troponin 3.58, and 64.76.  TSH is normal at 1.184.  Lipid  profile show total cholesterol 104, triglycerides 69, HDL 44, LDL 46.  Hemoglobin A1c is 5.1.  On admission, sodium is 138, potassium 4.1, glucose  137, BUN 22, creatinine 1.2.  Liver function tests were significant for an  AST of 70.  Other LFTs were normal.  Magnesium at that time was 2.3.  On  admission, white count 10.2, hemoglobin 12.4, hematocrit 35.5, platelet 244.  At that time, PT 13.8, INR 1.1, PTT 32.  Urinalysis is negative.  On  discharge home on January 09, 2004, sodium is 131, potassium 4.2, chloride  96, CO2 25, BUN 28, creatinine 1.5, glucose 96.  CBC of October 22 with  white count 7.4, hemoglobin 10.3, hematocrit 29.4, platelets 252.  On  January 08, 2004, BNP is 605.2.   EKG at the time of the office evaluation showed sinus rhythm with  inferolateral ST depression which is significantly changed from previous  tracing.  Other rhythm __________ was on January 02, 2004 in the ER, he had  VT/VF arrest, was defibrillated x 1, and then developed third-degree heart  block, then went to sinus rhythm.  EKG at that time showed new ST depression  V4-V6 and I, II, aVL.  He maintained sinus rhythm the remainder of the  hospitalization.   DISCHARGE MEDICATIONS:  1.  Ticlid 250 mg b.i.d.  2.  Aspirin 18 mg 1 daily.  3.  Lasix 80 mg b.i.d. for today and tomorrow and then decrease to 80 mg      once daily.  4.  K-Dur 20 mEq b.i.d.  5.  Altace 10 mg once daily.  6.  Zocor 20 mg once daily.  7.  Stop your Toprol/metoprolol.  8.  Coreg 3.125 mg b.i.d.  9.  Folic acid 1 mg once daily.  10. Fish oil as before.  11. Home oxygen at 2 Lpm.  12. Norvasc 2.5 mg once daily.  13. Doxazosin 2 mg  at night. 14. Protonix 40 mg once daily x 1 month, then  you may stop this.  15. Imdur 30 mg once daily.  16. Inspra 25 mg once daily.  17. Nitroglycerin 0.4 mg sublingual 1 as directed.   No strenuous activity, lifting over 5 pounds until you see Dr. Clarene Duke.   Low-cholesterol diet.   May gently wash groin site.   Have your blood drawn to check a BMET on Friday morning before you see Dr.  Clarene Duke.   He has an appointment to see Dr. Clarene Duke, Friday, October 28, at 1:45 p.m.,  as Dr. Jacinto Halim will not be available this week.  He will then return to the  care of Dr. Jacinto Halim ultimately.      Mary   MBE/MEDQ  D:  01/09/2004  T:  01/09/2004  Job:  [id number]   cc:   Erskine Speed, M.D.  [address]  [place]  [place] [address]  Fax: [phone]

## 2010-08-03 NOTE — Cardiovascular Report (Signed)
NAME:  Craig Parks, Craig Parks NO.:  [id number]   MEDICAL RECORD NO.:  [id number]                   PATIENT TYPE:  INP   LOCATION:  2399                                 FACILITY:  [facility]   PHYSICIAN:  Cristy Hilts. Jacinto Halim, M.D.                  DATE OF BIRTH:  [birth year]   DATE OF PROCEDURE:  07/26/2003  DATE OF DISCHARGE:                              CARDIAC CATHETERIZATION   PROCEDURE PERFORMED:  1. Left ventriculography.  2. Selective right and left coronary arteriography.  3. Right innominate arteriography with visualization of RIMA and left     subclavian arteriography with visualization of LIMA.  4. Abdominal aortogram.   INDICATION:  Mr. Ruddy is a 77 year old active Caucasian male with history  of hypertension, unknown lipid status.  He has strong family history of  premature coronary artery disease who was admitted through the emergency  room with chest pain suggestive of unstable angina.  He had positive  troponins with diagonals of NSTEMI.  He was brought to the cardiac  catheterization lab for evaluation of his coronary anatomy.  He also had  significant EKG changes in the form of anterior wall and inferior wall ST  segment depression by 2 mm.   HEMODYNAMIC DATA:  1. The left ventricular pressures were 135/8 with end-diastolic pressure of     14 mmHg.  2. Aortic pressure 126/64 with a mean of 91 mmHg.  3. There was no pressure gradient across the aortic valve.   ANGIOGRAPHIC DATA:  Left ventricle:  Left ventricular systolic function was  normal and ejection fraction was estimated at 55-60%.  There is no  significant mitral regurgitation.   Right coronary artery:  The right coronary artery is heavily calcified in  its ostium.  There is mild luminal irregularity.  The distal PDA is small to  moderate sized vessel.  Has a 50-60% calcific stenosis.   Left main coronary artery:  Left main coronary artery is heavily calcified.  This is an eccentric  calcified 75-80% distal left main stenosed which  extends into the circumflex and LAD.   Circumflex coronary artery:  Circumflex coronary artery is ostial heavily  calcified 95% ostial stenosis.  Mid segment after the origin of large obtuse  marginal one has calcific 90% stenosis.  Otherwise, the circumflex is large  caliber vessel and has mild luminal irregularity.   Left anterior descending artery:  Left anterior descending artery is a large  caliber vessel.  It has a heavily calcified 90% ostial stenosis.  It gives  origin to moderate size diagonal one and diagonal two.  Diagonal one has  ostial 70% stenosis.  The LAD has mild luminal irregularity.  Diagonal two  has mild luminal irregularity.   Right innominate and RIMA and left subclavian LIMA were widely patent.   ABDOMINAL AORTOGRAM:  Abdominal aortogram revealed presence of two renal  arteries; one on either side.  They are widely patent.  The aortoiliac  bifurcation was widely patent.   IMPRESSION:  1. Normal left ventricular systolic function with ejection fraction of 55-     60% with no significant mitral regurgitation.  2. Heavily calcified coronary arteries especially in the proximal segment.  3. Posterior descending has 60% stenosis which is calcified.  4. Left main has an eccentric 75-80% distal stenosis with extends into     circumflex and left anterior descending.  5. Ostial circumflex calcified 95% in mid after origin of OM-1 90%.  6. Left anterior descending ostium has 90% stenosis, diagonal one 70%     stenosis at the ostium.   RECOMMENDATIONS:  Based on the coronary anatomy, the patient will benefit  from coronary artery bypass grafting during this hospital admission.   Continued aggressive risk factor modification is indicated.   TECHNIQUE OF PROCEDURE:  Under usual sterile precautions, using a 6 French  right femoral arterial access, a 6 [place] multipurpose pigtail catheter was  advanced into the ascending  aorta over a 0.035-inch J wire.  The catheter  was then gently advanced to the left ventricle and left ventricular  pressures were monitored.  Hand contrast injection of the left ventricle was  performed both in the LAO and RAO projection.  The catheter was flushed with  saline and pulled back into the ascending aorta and pressure gradient across  the aortic valve was monitored.  The right coronary artery was selectively  engaged and angiography was performed.  Then, the catheter was pulled out of  the aorta and LIMA, RIMA were visualized and abdominal aortogram was  performed through hand contrast injection and the catheter was pulled out of  the body in the usual fashion.  A 6 French Judkins left 4 diagnostic  catheter was utilized to engage the left main coronary artery and  angiography was repeated. Then, the catheter was pulled out of the body in  the usual fashion.  The patient tolerated the procedure well.  There was no  immediate complications noted.                                               Cristy Hilts. Jacinto Halim, M.D.    Pilar Plate  D:  07/26/2003  T:  07/27/2003  Job:  [id number]   cc:   Erskine Speed, M.D.  [address]  [place]  [place] [address]  Fax: [phone]   [facility]

## 2010-08-03 NOTE — Consult Note (Signed)
NAME:  Craig Parks, Craig Parks NO.:  [id number]   MEDICAL RECORD NO.:  [id number]                   PATIENT TYPE:  INP   LOCATION:  2910                                 FACILITY:  [facility]   PHYSICIAN:  Kerin Perna III, M.D.           DATE OF BIRTH:  1930-02-10   DATE OF CONSULTATION:  07/26/2003  DATE OF DISCHARGE:                                   CONSULTATION   PHYSICIAN REQUESTING CONSULTATION:  Vonna Kotyk R. Jacinto Halim, M.D.   PRIMARY CARE PHYSICIAN:  Erskine Speed, M.D.   REASON FOR CONSULTATION:  Severe left main with three-vessel coronary artery  disease and subendocardial MI.   CHIEF COMPLAINT:  Chest pain.   HISTORY OF PRESENT ILLNESS:  I was asked to evaluate this 77 year old white  male for potential surgical coronary revascularization for recently-  diagnosed severe left main and three-vessel coronary artery disease.  The  patient has had three episodes of chest pain over the past two weeks, which  have come on at rest consisting of severe substernal squeezing pain with  radiation into the shoulders, neck, and back.  There has been some  indigestion and nausea associated with this as well as diaphoresis and arm  tingling.  The patient presented to the emergency department after being  transferred from an urgent care center.  He had nonspecific EKG changes and  his troponin was mildly elevated at 0.6.  He was placed on nitroglycerin and  Integrilin and his chest pain resolved.  He underwent cardiac  catheterization earlier this morning by Dr. Jacinto Halim.  This demonstrated an 80%  stenosis of the left main, a 90% stenosis of the proximal circumflex, 90%  stenosis of the proximal LAD, and heavy calcification of the proximal right  coronary.  The patient's ejection fraction was 55%, and left ventricular end-  diastolic pressure was 14 mmHg.  There was no evidence of mitral  regurgitation or aortic stenosis.  Based on the patient's coronary anatomy  and  symptoms, he was felt to be a candidate for surgical revascularization.   PAST MEDICAL HISTORY:  1. Hypertension.  2. Heavy alcohol use.  3. Status post laminectomy (lumbar) x3.  4. No known drug allergies.  5. Hyperlipidemia.   HOME MEDICATIONS:  He states he is on a beta blocker and a diuretic.   SOCIAL HISTORY:  The patient works part-time as a Electrical engineer at [employer].  He lives with his wife, who is disabled from psychiatric illness.  He denies smoking for the past five or six years but does drink 10 beers  every day as well as a quart of bourbon every week.   FAMILY HISTORY:  Positive for coronary disease and cancer.   REVIEW OF SYSTEMS:  GENERAL:  No recent change in weight, fever, or night  sweats.  ENT:  No difficulty with vision.  He has a loose left upper molar  and has  not seen a dentist in over 25 years.  He denies difficulty  swallowing.  PULMONARY:  No history of thoracic trauma, productive cough or  pneumonia or abnormal chest x-ray.  His portable chest x-ray in the  emergency department shows mild cardiomegaly and some interstitial markings  consistent with mild edema.  CARDIAC:  Positive for his unstable angina and  non-Q-wave MI.  This is his first cardiac catheterization.  GASTROINTESTINAL:  No history of bleeding ulcer, change of bowel habits,  jaundice, or hepatitis.  VASCULAR:  No DVT, TIA, or claudication.  ENDOCRINE:  No diabetes or thyroid disease.  UROLOGIC:  Nocturia x2.  No  history of hematuria or prostate cancer.  HEMATOLOGIC:  No history of blood  transfusion or bleeding disorder.   PHYSICAL EXAMINATION:  VITAL SIGNS:  The patient is 5 feet 8 inches and  weighs 205 pounds.  Blood pressure 130/70, heart rate 80, respirations 18,  room air saturation 95%.  GENERAL:  A somewhat anxious middle-aged white male in his hospital room  ([facility]U) following cardiac catheterization.  He is in no distress.  He is on IV  nitroglycerin and heparin.  HEENT:   Normocephalic.  He has a loose upper left molar tooth.  Pharynx is  clear.  NECK:  Without JVD and he has a soft right carotid bruit.  LYMPHATIC:  No palpable adenopathy.  CHEST:  Lungs are clear.  There is no thoracic deformity.  CARDIAC:  Regular rhythm without S3 gallop or murmur.  ABDOMEN:  Slightly obese, nontender, without mass.  EXTREMITIES:  No clubbing, edema, or cyanosis.  Peripheral pulses are 2+ in  all extremities.  RECTAL:  Deferred.  SKIN:  Without rash or lesion.  NEUROLOGIC:  Alert and oriented.  There is no focal motor deficit.   LABORATORY DATA:  I reviewed the coronary arteriograms with Dr. Jacinto Halim and  agree with interpretation of severe left main with three-vessel disease.  His left ventricular function is well-preserved.  His hematocrit is 43%.  His blood sugar is 150, and his creatinine is 1.1.   IMPRESSION AND PLAN:  The patient has significant left main and three-vessel  disease and would benefit from surgical coronary revascularization.  I  discussed the procedure with the patient and his son, including the  indications, expected benefits, alternatives, and associated risks of MI,  bleeding, wound infection, stroke, and death.  We discussed the impact that  his heavy alcohol intake could have on postoperative recovery, including the  possibility of alcohol withdrawal.  All questions regarding the procedure  were addressed, and the patient will be scheduled for surgery the morning of  May 11.                                               Mikey Bussing, M.D.    PV/MEDQ  D:  07/26/2003  T:  07/26/2003  Job:  [id number]   cc:   Cristy Hilts. Jacinto Halim, M.D.  [address]  [place]  [place] [address]  Fax: [phone]   Erskine Speed, M.D.  [address]  [place]  [place] [address]  Fax: [phone]

## 2010-08-03 NOTE — Op Note (Signed)
NAME:  Craig Parks, Craig Parks NO.:  [id number]   MEDICAL RECORD NO.:  [id number]                   PATIENT TYPE:  INP   LOCATION:  2312                                 FACILITY:  [facility]   PHYSICIAN:  Kerin Perna III, M.D.           DATE OF BIRTH:  1929-03-21   DATE OF PROCEDURE:  07/27/2003  DATE OF DISCHARGE:                                 OPERATIVE REPORT   OPERATION PERFORMED:  Coronary artery bypass grafting times five (left  internal mammary artery to left anterior descending, saphenous vein graft to  first diagonal, saphenous vein graft to right coronary, sequential saphenous  vein graft to obtuse marginal 1 and obtuse marginal 2).   PREOPERATIVE DIAGNOSIS:  Class 4 unstable angina with non-Q wave myocardial  infarction, left main and three-vessel coronary artery disease.   POSTOPERATIVE DIAGNOSIS:  Class 4 unstable angina with non-Q wave myocardial  infarction, left main and three-vessel coronary artery disease.   SURGEON:  Kerin Perna, M.D.   ASSISTANT:  Rowe Clack, P.A.-C.   ANESTHESIA:  General.   ANESTHESIOLOGIST:  Dr. Laverle Hobby.   INDICATIONS FOR PROCEDURE:  The patient is a 77 year old white male who  presented with unstable angina and positive cardiac enzymes.  Cardiac  catheterization by Dr. Jacinto Halim demonstrated an 80% left main stenosis with  high grade proximal stenoses of the LAD, first diagonal and circumflex.  The  right coronary also had heavy calcification at the ostium with stenosis.  He  was felt to be a candidate for surgical revascularization due to his  symptoms, coronary anatomy and preserved left ventricular function.  Prior  to surgery, I examined the patient in his CCU room and reviewed the results  of the cardiac catheterization with the patient and his family.  I discussed  the indications and expected benefits of coronary artery bypass surgery for  treatment of his coronary artery disease.  I reviewed with  the patient the  major aspects of the proposed procedure including the choice of conduit for  grafts, the location of the surgical incisions, the use of general  anesthesia, and cardiopulmonary bypass, and the expected postoperative  hospital recovery.  I discussed with the patient the alternatives to  surgical therapy for treatment of his coronary artery disease.  I discussed  with the patient the risks to him of coronary artery bypass surgery  including the risks of MI, CVA, bleeding, blood transfusion requirement,  wound infection and death.  The patient understood that his past history of  heavy alcohol use could have a negative impact on his postoperative recovery  with respect to altered mental status, agitation and pulmonary problems.  The patient understood all these implications for the surgery and agreed to  proceed with the operation as planned under what I felt was an informed  consent.   OPERATIVE FINDINGS:  The heart was enlarged consistent with a history of  hypertension.  The vein was harvested from the right leg using endoscopic  technique.  The mammary artery was used and was a good vessel with excellent  flow.  The patient was given aprotinin protocol for this operation since he  was on preoperative Integrilin.  The patient was given one unit of platelets  after reversal of heparin with protamine due to persistent coagulopathy.   DESCRIPTION OF PROCEDURE:  The patient was brought to the operating room and  placed supine on the operating table where general anesthesia was induced  under invasive hemodynamic monitoring.  The chest and legs were prepped with  Betadine and draped as a sterile field.   A sternal incision was then made and the saphenous vein was harvested from  the right leg using endoscopic technique.  The mammary artery was harvested  as a pedicle graft.  The sternal retractor was placed and heparin was  administered.  He was documented as being  therapeutic.  The pericardium was  opened and suspended.  Pursestrings were placed in the ascending aorta and  right atrium, the patient was cannulated and placed on bypass. The  coronaries were identified for grafting and cardioplegia cannulas were  placed for both antegrade aortic and retrograde coronary sinus cardioplegia.  The patient was cooled to 30 degrees and the aortic cross-clamp was applied.  800 mL of cold blood cardioplegia was delivered in split doses between the  aortic and coronary sinus catheters.  There was good cardioplegic arrest and  septal temperature dropped less than 14 degrees.  The distal coronary  anastomoses were then performed.   The first distal anastomosis was to the distal right.  This had a proximal  heavily calcified ostial stenosis.  A reversed saphenous vein was sewn end-  to-side with a running 7-0 Prolene suture and there was good flow through  the graft.  The second distal anastomosis was the first diagonal.  This was  a 1.4 mm vessel with proximal 80% stenosis at its ostium.  A reversed  saphenous vein was sewn end-to-side with running 7-0 Prolene suture.  There  was good flow through the graft.  Cardioplegia was redosed.  The third and  fourth distal anastomoses consisted of a sequential saphenous vein graft to  the OM1 and OM2.  The OM1 was a 1.7 mm vessel with proximal 90% stenosis.  A  side-to-side anastomosis with the vein was sewn using running 7-0 Prolene.  There was good flow through the graft.  The fourth distal anastomosis was  the continuation of the sequential vein to the OM2.  This was a smaller 1.5  mm vessel with proximal 90% stenosis. The end of the vein was sewn end-to-  side with running 7-0 Prolene suture and there was good flow through the  graft.  Cardioplegia was redosed.  The fifth distal anastomosis was to the  distal third of the LAD.  This was a 1.75 mm vessel with proximal 80% stenosis.  The left IMA pedicle was brought  through an opening created in  the left lateral pericardium and was brought down onto the LAD and sewn end-  to-side with running 8-0 Prolene.  There was excellent flow through the  anastomosis and an immediate rise was noted in septal temperature after  releasing the pedicle clamp on the mammary artery.  The mammary pedicle was  secured to the epicardium and the aortic cross-clamp was removed.   The heart was cardioverted back to a regular rhythm.  A partial occlusion  clamp was placed on the ascending aorta and three anastomoses were performed  using a 4.0 mm punch and running 6-0 Prolene.  The partial clamp was removed  and the vein grafts were perfused.  Each had good flow and hemostasis was  documented in the proximal and distal anastomoses.  The patient was rewarmed  and reperfused.  Temporary pacing wires were applied.  The lung was re-  expanded and the ventilator was resumed.  The patient was weaned from bypass  without difficulty.  Blood pressure and cardiac output were stable.  Protamine was administered without adverse reaction.  Cannulas were removed.  The mediastinum was irrigated with warm antibiotic irrigation.  The leg  incision was irrigated and closed in a standard fashion.  The superior  pericardial fat was closed over the aorta and vein grafts.  Two mediastinal  and a left pleural chest tube were placed and brought out through separate  incisions.  The sternum was closed with interrupted steel wire. The  pectoralis fascia and subcutaneous tissue layers were closed with a running  #1 Vicryl.  The skin was closed with a subcuticular.  Total bypass time was  140 minutes with cross-clamp time of 75 minutes.                                               Mikey Bussing, M.D.    PV/MEDQ  D:  07/27/2003  T:  07/28/2003  Job:  [id number]   cc:   Erskine Speed, M.D.  [address], [address]  [place]  [place] [address]  Fax: [phone]

## 2010-08-03 NOTE — H&P (Signed)
NAME:  Craig Parks, Craig Parks NO.:  [id number]   MEDICAL RECORD NO.:  [id number]                   PATIENT TYPE:  INP   LOCATION:  2910                                 FACILITY:  [facility]   PHYSICIAN:  Cristy Hilts. Jacinto Halim, M.D.                  DATE OF BIRTH:  01-Jul-1929   DATE OF ADMISSION:  07/25/2003  DATE OF DISCHARGE:                                HISTORY & PHYSICAL   Audio too short to transcribe (less than 5 seconds)                                                Jay R. Jacinto Halim, M.D.    Pilar Plate  D:  07/26/2003  T:  07/26/2003  Job:  [id number]

## 2010-08-03 NOTE — Cardiovascular Report (Signed)
NAME:  Craig Parks, Craig Parks.:  [id number]   MEDICAL RECORD NO.:  [id number]          PATIENT TYPE:  INP   LOCATION:  2921                         FACILITY:  [facility]   PHYSICIAN:  Nicki Guadalajara, M.D.     DATE OF BIRTH:  1929-11-22   DATE OF PROCEDURE:  DATE OF DISCHARGE:                              CARDIAC CATHETERIZATION   INDICATIONS:  Craig Parks is a 77 year old white male patient of Drs.  Ganji and Ekstein Guise.  He has known coronary artery disease and on Jul 27, 2003, underwent CVG revascularization surgery by Dr. Donata Clay x5 with a  LIMA to the LAD, saphenous vein to the first diagonal, saphenous vein to the  right coronary artery, and sequential saphenous vein graft to the obtuse  marginal 1 and obtuse marginal 2 vessel.  Preoperatively, the patient had  presented with Class IV unstable angina with non-Q-wave myocardial  infarction and was found to have high-grade left main and 3-vessel coronary  artery disease.  The patient does have a PLAVIX ALLERGY.  Apparently, he  presented to the office today with recurrent chest pain suggesting unstable  angina.  He was admitted via the emergency room from the office waiting for  a bed, and while in the emergency room developed a VT/VF cardiac arrest for  which he was successfully defibrillated x1.  His ECG post-defibrillation  showed a pronounced ST segment anterolaterally.  He also was treated with IV  Lopressor.  He was taken up to the cardiac catheterization laboratory.   PROCEDURE:  After premedication intravenously, the patient was prepped and  draped in the usual fashion.  His right femoral artery was punctured  anteriorly and a 6-French arterial sheath was inserted.  Diagnostic  catheterization was done with 6-French Judkins 4 left and right coronary  catheters.  The right catheter was used for selective angiography into the  saphenous vein grafts.  The patient did receive several doses of  intracoronary  nitroglycerin down the saphenous vein graft to the OM-1 and OM-  2 vessels sequentially due to high-grade disease noted at 4 sites, which  significantly improved the third and fourth lesion, suggesting a component  of spasms in these areas.  In addition, IC nitroglycerin was also  administered down a small right coronary artery graft to see if this would  significantly vasodilate, which it did not.  An internal mammary artery  catheter was used for selective angiography into the left internal mammary  artery.  A pigtail catheter was then inserted for biplane selective  ventriculography.  At this point, angiograms were reviewed with Dr. Jacinto Halim,  the patient's primary cardiologist.  It was felt that attempt at coronary  intervention in the saphenous vein graft to the OM vessel should be  performed and that the third and fourth lesions had a major component of  spasm and, therefore, ideally these would not be intervened upon.  Because  of the PLAVIX ALLERGY, the patient was pretreated with Ticlopidine 500 mg  with plans to initiate 250 mg b.i.d. dosing.  The patient had already been  on Integrilin, which  was started in the emergency room, and had received an  additional bolus in the laboratory to complete a double-bolus regimen.  ACT  was documented to be in the therapeutic side. and he received an additional  2000 units of intravenous heparin.  An FR-4 guide was used for the  intervention.  A Luge wire was advanced down the saphenous vein graft  sequentially supplying the OM-1 and OM-2 vessel.  It was felt that both  proximal sites could be treated with a long drug-eluting stent.  A 3.5 x 32  mm Taxus stent was then inserted with careful attention to cover both the  most proximal portion of the proximal lesion and the most distal portion of  the second lesion.  This was dilated to 12 atmospheres.  A 3.75 x 20 mm  Quantum balloon was then used for post-stent dilatation up to 3.7 to 3.8 mm.  IC  nitroglycerin had been administered again before the intervention to make  certain that the proximal lesions were not spasm.  The other lesions just  prior to the OM-1 anastomosis and just prior to the OM-2 anastomosis were  significantly improved with IC nitroglycerin and therefore these were not  intervened upon.  The patient tolerated the procedure well and returned to  his room in satisfactory condition.   HEMODYNAMIC DATA:  Pressure was 107/51, left ventricular pressure was  107/18, post A-wave 30.   ANGIOGRAPHIC DATA:  The left main coronary artery had distal tapering of  80%.  At the left main distally, there did appear to be a suggestion of some  mild thrombus, with the 80% stenosis extending to the ostial portion of the  LAD.  The circumflex was flush-occluded at the left main.   The LAD again had an 80% ostial stenosis.  There was an 80% stenosis in the  small diagonal vessel, and the second diagonal vessel was a bifurcating  vessel, which had total occlusion of the inferior limb.  Beyond the second  diagonal takeoff, the LAD gave rise to several septal perforating arteries  and a flush-and-fill phenomenon was seen due to competitive filling from the  LIMA graft.   The circumflex vessel was flush-occluded at its origin from the left main.   The right coronary artery had moderate calcification and had diffuse  irregularity throughout of 20-30%.  However, there also appeared to be a 70-  80% smooth ostial tapering to the vessel.  There was 50% stenosis in the mid  PDA.   The sequential saphenous vein graft supplying the OM-1 and OM-2 vessel was a  large caliber graft.  In the proximal third of the graft, there were tandem  stenoses of 95%.  In the segment between the tandem 95% stenoses, the  unstenosed segment was somewhat ectatic.  Initially, there appeared to be at least 70% narrowing in the graft just proximal to the OM-1 anastomosis and  at least 70-80% just proximal to  the OM-2 stenosis.  However, following IC  nitroglycerin there was significant improvement in the third and fourth  lesions without any improvement in the high grade proximal tandem 95%  stenoses.  There was residual narrowing of 40% just proximal to the OM-2  anastomosis.  There was no disease beyond the OM-1 vessel, but there was not  any filling of the AV groove circumflex from this insertion.  However, from  the insertion into the OM-2 vessel there was retrograde filling of the  entire AV groove circumflex up to the  left main occlusion.   The saphenous vein graft supplying the diagonal vessel was occluded at its  origin.   The saphenous vein graft supplying the right coronary artery was small  caliber throughout, suggesting some potential atresia to this graft.  This  did not improve following IC nitroglycerin administration.  There was 20-30%  ostial narrowing.  The graft anastomosed into the proximal PDA.  There was a  50-60% lesion in the mid PDA beyond the graft insertion.   The left internal mammary artery had a tortuous segment.  On some views, it  appeared that there was a kink within this tortuous segment, but on other  views this did not appear to be a significant stenosis in this kinked  segment due to the vessel tortuosity.  There was a normal filling of the LAD  system from the LIMA graft.   Biplane selective ventriculography revealed moderate LV dysfunction with an  ejection fraction of approximately 35%.  There was hypocontractility in the  inferior wall as well as in the inferoapical  to low to mid posterolateral  wall.   Following percutaneous coronary intervention after numerous doses of  intracoronary nitroglycerin down the saphenous vein grafts supplying the OM-  1 and OM-2 vessels sequentially, the tandem 95% proximal stenoses were  reduced to 0-5%.  There was TIMI-3 flow.  There was no narrowing proximal to  the OM-1 vessel, but residual narrowing was present  of 40-50% proximal to  the OM-2 stenosis, which was not dilated.   IMPRESSION:  1.  Moderate left ventricular dysfunction with hypokinesis inferiorly,      inferoapical to the low to mid posterolateral wall, and an ejection      fraction of approximately 35%.  2.  Severe native coronary artery disease with 80% distal left main      stenosis, 80% ostial left anterior descending stenosis, 80% first      diagonal stenosis with total occlusion of the inferior branch of the      second diagonal vessel; total flush-occlusion of the circumflex at the      left main with evidence for some thrombus at the left main; and 70-80%      smooth ostial tapering of the right coronary artery with diffuse      irregularity of 20-30% throughout a 50-60% mid posterior descending      artery stenosis.  3.  Patient left internal mammary artery to the left anterior descending,     but with evidence for a tortuous mid segment with a suggestion of a mild      kink at this site without high-grade stenosis.  4.  Totally occluded saphenous vein graft supplying the diagonal vessel.  5.  Diseased saphenous grafts sequentially supplying the obtuse marginal-1      and obtuse marginal-2 vessel, which initially had 2 stenoses of 95%      proximally, a 70% stenosis proximal to the obtuse marginal-1, and 80%      stenosis proximal to the obtuse marginal-2 anastomoses, but with      evidence for spasm, with improvement in the distal lesions following      intracoronary nitroglycerin administration, but without improvement in      the high-grade tandem proximal graft stenoses.  6.  A small, patent, but mildly atretic right coronary artery saphenous vein      graft with 50-60% stenosis in the mid posterior descending artery.  7.  Successful stenting of the tandem 95% stenoses in the  proximal portion      of the saphenous vein graft supplying the circumflex vessel with a 3.5 x      32 mm Taxus stent post-dilated to 3.75 to 3.8  mm size down with double-      bolus Integrilin/heparinization, and Ticlopidine in place of Plavix due      to the patient's PLAVIX ALLERGY as well as ASPIRIN.       TK/MEDQ  D:  01/02/2004  T:  01/02/2004  Job:  [id number]   cc:   Cristy Hilts. Jacinto Halim, MD  [address]  [place]  [place] [address]  Fax: [phone]   Mikey Bussing, M.D.  [address]  [place]  [place] [address]   Darlin Priestly, MD  [phone]  [place]  [place] [address]  Fax: [phone]   Erskine Speed, M.D.  [address] [place]  [place]  [place] [address]  Fax: [phone]

## 2010-08-03 NOTE — H&P (Signed)
NAME:  Craig Parks, Craig Parks NO.:  [id number]   MEDICAL RECORD NO.:  [id number]          PATIENT TYPE:  EMS   LOCATION:  MAJO                         FACILITY:  [facility]   PHYSICIAN:  Darlin Priestly, MD  DATE OF BIRTH:  [birth year]   DATE OF ADMISSION:  01/02/2004  DATE OF DISCHARGE:                                HISTORY & PHYSICAL   CHIEF COMPLAINT:  Chest pain.   HISTORY OF PRESENT ILLNESS:  A 77 year old white male married patient of Dr.  Jacinto Halim and Dr. Chilton Si.  Recently, in May 2005 developed a non-Q-wave MI.  The  only symptom was dizziness and syncope.  Underwent cardiac catheterization,  was found to have significant coronary disease:  PDA 60%, left main 75-80%,  ostial circumflex 95%, LAD 90%, distal 70%.  EF was 55-60%.  He then  underwent coronary artery bypass grafting.   Bypass grafting by Dr. Kathlee Nations Trigt included LIMA to the LAD, saphenous  vein graft to the first diagonal, saphenous vein graft to the RCA, and  sequential saphenous vein graft to obtuse marginal one and obtuse marginal  two.  He did very well postprocedure and did have a nuclear study in July  2005 that was abnormal.  On that Cardiolite study there was evidence of  anterior and lateral ischemia from apex to mid ventricle.  EF was 56%.  Dr.  Jacinto Halim discussed the test with the patient.  The patient was asymptomatic and  stable and felt that no further tests were needed at that point.  He would  be advanced to cardiac rehab.   Since that time, the patient has done well without any complaints at all  until Saturday.  While lifting weights, he was doing some curls with 40-  pound curls.  He was in his 15th repetition when he developed some mild  chest discomfort.  He finished and with rest, the discomfort went away.  About 2 hours later it returned more significantly, and has been there ever  since.  He is not able to sleep at night, mild shortness of breath has  occurred, but it is  increasing in significance.   The pain is described as a tight pressure across his chest.  Occasionally,  it will radiate to his back.  He is short of breath.  No vomiting but he has  been very nauseated this morning with saliva building up in his mouth that  started about 3 a.m. this morning.  He has tried nitroglycerin at home  without any relief at all.  When he was using the hole diggers, it increased  his pain significantly.   He came to the office here.  His EKG has significant changes of ST  depressions, with inferolateral ischemia.  We gave him aspirin here in the  office and we are sending him by EMS to [facility] for cardiac  catheterization today.   PAST MEDICAL HISTORY:  1.  Cardiovascular as stated.  2.  Hyperlipidemia.  3.  He was diagnosed with diabetes and started on Glucophage 500 mg twice a      day  during that hospitalization.  4.  Hypertension.  5.  He has had a lumbar laminectomy x3.  6.  Past history of alcohol use.   ALLERGIES:  PLAVIX causes a rash.   MEDICATIONS:  1.  Altace 5 mg daily.  2.  Aspirin 81 daily.  3.  Zocor 20 mg daily.  4.  Folic acid 1 mg daily.  5.  Toprol-XL 100 daily.  6.  Nitroglycerin p.r.n.  7.  Doxazosin 2 mg daily.   FAMILY HISTORY:  Not significant to this admission.   SOCIAL HISTORY:  Married, two children, three grandchildren.  He is  exercising and fairly active.  Does not use tobacco or alcohol.   REVIEW OF SYSTEMS:  No weight loss recently.  No colds or fevers.  Denies  claudication.  SKIN:  No rashes or ulcers.  EYES:  Wears glasses.  RESPIRATION:  Shortness of breath as described.  No sleep now for two  nights.  CARDIOVASCULAR:  Chest pain as described, no edema.  GI:  No  indigestion, diarrhea, constipation, no melena.  GU:  No hematuria or  dysuria.  ENDOCRINE:  He was positive for diabetes in the hospital but is  diet controlled.  NEURO:  He has been lightheaded, and this was his only  symptom prior to his MI  in May.   PHYSICAL EXAMINATION:  VITAL SIGNS TODAY:  Blood pressure in the left arm  124/82, weight 198, height 5 feet 9 inches, heart rate 77.  EKG as described  with inferolateral ST depressions, significantly changed from previous  tracing.  GENERAL:  Alert and oriented white male in no acute distress in appearance.  SKIN:  Warm and dry.  Brisk capillary refill.  HEENT:  Sclerae clear.  NECK:  Supple.  No JVD, no bruits, no thyromegaly.  LUNGS:  Clear without rales, rhonchi, or wheezes.  HEART SOUNDS:  S1, S2, regular rate and rhythm.  No murmur detected.  S4  gallop is present.  ABDOMEN:  Soft, nontender, positive bowel sounds.  Cannot palpate liver,  spleen, or masses.  EXTREMITIES:  Lower extremities without edema, 2+ pedals bilaterally, 2+  radials bilaterally.  CHEST:  On chest wall palpation, mild tenderness in the left anterior  section but that is very minimal and not comparable to his current chest  pain.   IMPRESSION:  1.  Unstable angina, current, and unrelieved with nitroglycerin.  2.  Electrocardiogram changes with ST inferolateral ischemia.  3.  Coronary disease status post coronary artery bypass grafting May 2005.  4.  Hypertension.  5.  History of diabetes.   PLAN:  Will put him on oxygen and I called EMS to take him to [facility]  for probable catheterization today.  IV nitroglycerin, heparin, and  Integrilin.  Also gave him an aspirin and had him chew it here in the  office.  Dr. Jenne Campus saw him and assessed him with me.      [name]   LRI/MEDQ  D:  01/02/2004  T:  01/02/2004  Job:  [id number]   cc:   Darlin Priestly, MD  [phone] [address], [address]  [place]  [place] [address]  Fax: [phone]   Erskine Speed, M.D.  [address]  [place]  [place] [address]  Fax: [phone]

## 2010-12-07 LAB — CBC
HCT: 40.1
Hemoglobin: 11.4 — ABNORMAL LOW
Hemoglobin: 12.7 — ABNORMAL LOW
MCV: 97.6
MCV: 98.2
Platelets: 158
Platelets: 167
RBC: 3.32 — ABNORMAL LOW
RBC: 3.86 — ABNORMAL LOW
RDW: 14.9
RDW: 13.9
WBC: 11.6 — ABNORMAL HIGH
WBC: 8

## 2010-12-07 LAB — COMPREHENSIVE METABOLIC PANEL
AST: 33
Albumin: 3.1 — ABNORMAL LOW
Albumin: 3.9
BUN: 18
Calcium: 8.5
Chloride: 104
Chloride: 105
Creatinine, Ser: 1.09
Creatinine, Ser: 1.1
GFR calc Af Amer: >60
Sodium: 139
Total Bilirubin: 1.1
Total Bilirubin: 1.2

## 2010-12-07 LAB — BASIC METABOLIC PANEL
Calcium: 9.2
Calcium: 9.3
GFR calc Af Amer: >60
GFR calc Af Amer: >60
GFR calc non Af Amer: >60
GFR calc non Af Amer: >60
Sodium: 139
Sodium: 140

## 2010-12-07 LAB — URINALYSIS, ROUTINE W REFLEX MICROSCOPIC
Nitrite: NEGATIVE
Protein, ur: NEGATIVE
Specific Gravity, Urine: 1.024
Urobilinogen, UA: 1

## 2010-12-07 LAB — TYPE AND SCREEN: ABO/RH(D): O POS

## 2010-12-07 LAB — PROTIME-INR
INR: 1.2
INR: 1.2
INR: 1.3
INR: 1.6 — ABNORMAL HIGH
INR: 1.8 — ABNORMAL HIGH
INR: 1.6 — ABNORMAL HIGH
Prothrombin Time: 15.7 — ABNORMAL HIGH
Prothrombin Time: 16.2 — ABNORMAL HIGH
Prothrombin Time: 19.2 — ABNORMAL HIGH
Prothrombin Time: 21.4 — ABNORMAL HIGH
Prothrombin Time: 21.2 — ABNORMAL HIGH

## 2010-12-07 LAB — DIFFERENTIAL
Basophils Absolute: 0
Lymphocytes Relative: 28
Monocytes Absolute: 0.9
Neutro Abs: 4.6

## 2010-12-07 LAB — CARDIAC PANEL(CRET KIN+CKTOT+MB+TROPI)
CK, MB: 9.4 — ABNORMAL HIGH
Total CK: 847 — ABNORMAL HIGH
Troponin I: 0.04

## 2010-12-07 LAB — APTT
aPTT: 34
aPTT: 34

## 2010-12-07 LAB — PREPARE FRESH FROZEN PLASMA

## 2010-12-07 LAB — POCT I-STAT 4, (NA,K, GLUC, HGB,HCT): Glucose, Bld: 151 — ABNORMAL HIGH

## 2010-12-18 LAB — BASIC METABOLIC PANEL
BUN: 13
CO2: 29
Calcium: 9
Chloride: 108
Creatinine, Ser: 1.18
GFR calc Af Amer: >60
GFR calc Af Amer: >60
Potassium: 4.3
Sodium: 139

## 2010-12-18 LAB — CBC
HCT: 35.3 — ABNORMAL LOW
HCT: 39
Hemoglobin: 13.2
MCHC: 33.9
MCHC: 34.2
Platelets: 137 — ABNORMAL LOW
RBC: 3.42 — ABNORMAL LOW
RBC: 3.86 — ABNORMAL LOW
WBC: 6.4
WBC: 7.4

## 2010-12-18 LAB — CK TOTAL AND CKMB (NOT AT ARMC)
CK, MB: 1.6
Relative Index: INVALID

## 2010-12-18 LAB — LIPID PANEL
Cholesterol: 140
HDL: 44
LDL Cholesterol: 84
Total CHOL/HDL Ratio: 3.2
Triglycerides: 62

## 2010-12-18 LAB — HEPARIN LEVEL (UNFRACTIONATED): Heparin Unfractionated: 0.5

## 2010-12-18 LAB — B-NATRIURETIC PEPTIDE (CONVERTED LAB)
Pro B Natriuretic peptide (BNP): 329 — ABNORMAL HIGH
Pro B Natriuretic peptide (BNP): 541 — ABNORMAL HIGH

## 2010-12-18 LAB — COMPREHENSIVE METABOLIC PANEL
ALT: 17
Alkaline Phosphatase: 76
CO2: 28
GFR calc non Af Amer: 51 — ABNORMAL LOW
Glucose, Bld: 103 — ABNORMAL HIGH
Potassium: 3.8
Sodium: 138

## 2010-12-18 LAB — CARDIAC PANEL(CRET KIN+CKTOT+MB+TROPI)
CK, MB: 2.7
Relative Index: 2.5

## 2010-12-18 LAB — HEMOGLOBIN A1C: Hgb A1c MFr Bld: 5.4

## 2010-12-19 LAB — CBC
HCT: 41.4
Hemoglobin: 14.4
MCV: 98.1
RBC: 4.22
WBC: 8

## 2010-12-19 LAB — POCT CARDIAC MARKERS
Myoglobin, poc: 117
Troponin i, poc: <0.05

## 2010-12-19 LAB — DIFFERENTIAL
Eosinophils Absolute: 0.4
Eosinophils Relative: 5
Lymphs Abs: 2.1
Monocytes Absolute: 0.8
Monocytes Relative: 10

## 2010-12-19 LAB — POCT B-TYPE NATRIURETIC PEPTIDE (BNP): B Natriuretic Peptide, POC: 401 — ABNORMAL HIGH

## 2011-09-16 HISTORY — PX: CARDIAC CATHETERIZATION: SHX172

## 2011-09-26 ENCOUNTER — Encounter (HOSPITAL_COMMUNITY): Payer: Self-pay | Admitting: Pharmacy Technician

## 2011-10-08 ENCOUNTER — Encounter (HOSPITAL_COMMUNITY)
Admission: RE | Disposition: A | Discharge status: Home / Self Care | Payer: Self-pay | Source: Ambulatory Visit | Attending: Cardiology

## 2011-10-08 ENCOUNTER — Ambulatory Visit (HOSPITAL_COMMUNITY)
Admission: RE | Admit: 2011-10-08 | Discharge: 2011-10-08 | Disposition: A | Discharge status: Home / Self Care | Payer: Medicare Other | Source: Ambulatory Visit | Attending: Cardiology | Admitting: Cardiology

## 2011-10-08 DIAGNOSIS — I2510 Atherosclerotic heart disease of native coronary artery without angina pectoris: Secondary | ICD-10-CM | POA: Insufficient documentation

## 2011-10-08 DIAGNOSIS — Z951 Presence of aortocoronary bypass graft: Secondary | ICD-10-CM | POA: Insufficient documentation

## 2011-10-08 HISTORY — PX: LEFT AND RIGHT HEART CATHETERIZATION WITH CORONARY ANGIOGRAM: SHX5449

## 2011-10-08 LAB — POCT I-STAT 3, VENOUS BLOOD GAS (G3P V)
Bicarbonate: 24.3 mEq/L — ABNORMAL HIGH (ref 20.0–24.0)
TCO2: 26 mmol/L (ref 0–100)
pCO2, Ven: 45 mmHg (ref 45.0–50.0)
pH, Ven: 7.341 — ABNORMAL HIGH (ref 7.250–7.300)
pO2, Ven: 39 mmHg (ref 30.0–45.0)

## 2011-10-08 LAB — POCT I-STAT 3, ART BLOOD GAS (G3+)
pCO2 arterial: 41.6 mmHg (ref 35.0–45.0)
pH, Arterial: 7.377 (ref 7.350–7.450)

## 2011-10-08 SURGERY — LEFT AND RIGHT HEART CATHETERIZATION WITH CORONARY ANGIOGRAM
Anesthesia: LOCAL

## 2011-10-08 MED ORDER — SODIUM CHLORIDE 0.9 % IJ SOLN: 3.0000 mL | Freq: Two times a day (BID) | INTRAMUSCULAR | Status: DC

## 2011-10-08 MED ORDER — ASPIRIN 81 MG PO CHEW
CHEWABLE_TABLET | ORAL | Status: AC
  Filled 2011-10-08: qty 4

## 2011-10-08 MED ORDER — SODIUM CHLORIDE 0.9 % IJ SOLN: 3.0000 mL | INTRAMUSCULAR | Status: DC | PRN

## 2011-10-08 MED ORDER — BIVALIRUDIN 250 MG IV SOLR
INTRAVENOUS | Status: AC
  Filled 2011-10-08: qty 250

## 2011-10-08 MED ORDER — SODIUM CHLORIDE 0.9 % IV SOLN: 250.0000 mL | INTRAVENOUS | Status: DC | PRN

## 2011-10-08 MED ORDER — DIAZEPAM 5 MG PO TABS
ORAL_TABLET | ORAL | Status: AC
  Filled 2011-10-08: qty 1

## 2011-10-08 MED ORDER — ACETAMINOPHEN 325 MG PO TABS: 650.0000 mg | ORAL_TABLET | ORAL | Status: DC | PRN

## 2011-10-08 MED ORDER — LIDOCAINE HCL (PF) 1 % IJ SOLN
INTRAMUSCULAR | Status: AC
  Filled 2011-10-08: qty 30

## 2011-10-08 MED ORDER — SODIUM CHLORIDE 0.9 % IV SOLN: 1.0000 mL/kg/h | INTRAVENOUS | Status: DC

## 2011-10-08 MED ORDER — SODIUM CHLORIDE 0.9 % IV SOLN
INTRAVENOUS | Status: DC
  Administered 2011-10-08: 1000 mL via INTRAVENOUS

## 2011-10-08 MED ORDER — NITROGLYCERIN 0.2 MG/ML ON CALL CATH LAB
INTRAVENOUS | Status: AC
  Filled 2011-10-08: qty 1

## 2011-10-08 MED ORDER — HEPARIN (PORCINE) IN NACL 2-0.9 UNIT/ML-% IJ SOLN
INTRAMUSCULAR | Status: AC
  Filled 2011-10-08: qty 2000

## 2011-10-08 MED ORDER — HYDROMORPHONE HCL PF 2 MG/ML IJ SOLN
INTRAMUSCULAR | Status: AC
  Filled 2011-10-08: qty 1

## 2011-10-08 MED ORDER — ONDANSETRON HCL 4 MG/2ML IJ SOLN: 4.0000 mg | Freq: Four times a day (QID) | INTRAMUSCULAR | Status: DC | PRN

## 2011-10-08 MED ORDER — DIAZEPAM 5 MG PO TABS
5.0000 mg | ORAL_TABLET | ORAL | Status: AC
  Administered 2011-10-08: 5 mg via ORAL

## 2011-10-08 MED ORDER — ASPIRIN 81 MG PO CHEW
324.0000 mg | CHEWABLE_TABLET | ORAL | Status: AC
  Administered 2011-10-08: 324 mg via ORAL

## 2011-10-08 MED ORDER — MIDAZOLAM HCL 2 MG/2ML IJ SOLN
INTRAMUSCULAR | Status: AC
  Filled 2011-10-08: qty 2

## 2011-10-08 NOTE — Progress Notes (Signed)
UP AND WALKED AND TOL WELL; RIGHT GROIN STABLE; NO BLEEDING OR HEMATOMA 

## 2011-10-08 NOTE — Interval H&P Note (Signed)
History and Physical Interval Note:  10/08/2011 9:42 AM  Craig Parks  has presented today for surgery, with the diagnosis of Chest pain  The various methods of treatment have been discussed with the patient and family. After consideration of risks, benefits and other options for treatment, the patient has consented to  Procedure(s) (LRB): LEFT AND RIGHT HEART CATHETERIZATION WITH CORONARY ANGIOGRAM (N/A) and possible angioplasty as a surgical intervention .  The patient's history has been reviewed, patient examined, no change in status, stable for surgery.  I have reviewed the patient's chart and labs.  Questions were answered to the patient's satisfaction.     [name]

## 2011-10-08 NOTE — H&P (Signed)
  Please see paper chart  

## 2011-10-08 NOTE — CV Procedure (Signed)
Procedures performed: Femoral access Left heart catheterization including left ventriculography, selective right and left coronary arteriography. Saphenous vein graft and left internal mammary arteriography. Right heart catheterization and calculation of cardiac output and cardiac index by Fick. Right femoral arteriogram for evaluating right femoral arterial access.  Indication: [name] is a 78 year old Caucasian male with history of known coronary artery disease and has CABG in 2005. He has LIMA to LAD, SVG to OM 1 and OM 2, stent in the proximal segment of this vein graft, SVG to PDA. SVG to D1 is occluded. He now presents with chest pain which was very typical for exertional angina pectoris which was lifestyle limiting associated with worsening shortness of breath. He also has known history of moderate pulmonary hypertension. Hence is brought to the cardiac catheterization lab to evaluate right heart pressures and coronary anatomy. :   HEMODYNAMIC DATA: The left ventricle pressure was 137/8 with  EDP14. Aortic pressure was  135/56 with a mean of 87 mmHg.  There was no pressure gradient across the aortic valve   Right coronary artery: Right coronary is a large caliber vessel and a dominant vessel. It is occluded in the proximal segment. It is severely diffusely calcified and is flush occluded at the ostium. This is new compared to cardiac catheterization in 2009.  SVG to PDA: This is widely patent without any significant luminal obstruction.  Left main coronary artery: Left main coronary artery is a large caliber  vessel, which is smooth and has mild calcification.  Circumflex: This is occluded at the ostium. Previously the circumflex coronary artery was a large caliber vessel, giving origin to a large large OM-1 and OM-2. Again diffuse calcification is evident at the occluded segment.  SVG to OM 1 and OM 2: This is widely patent. The obtuse marginal 1 itself has diffuse disease in the  proximal segment proximal to the graft insertion. Retrograde filling of the circumflex is evident via the saphenous vein graft.  Ramus intermediate: NA.   LAD: LAD is a large caliber vessel, with severe diffuse and calcific stenoses involving the ostium and constituting about a 30-40% stenosis. Midsegment has tandem 95% stenosis. LAD gives origin to a small to moderate size diagonal 1 which is severely diffusely diseased. It gives origin to a large D2 which is unprotected. D2 has a ostial 99% stenosis. This is progressed from 80% in 2009.  LIMA to LAD: Widely patent. It retrogradely fills the LAD.  Left ventriculogram: Left ventriculography revealed ejection fraction  of 40-45%. Mild global hypokinesis. Inferior hypokinesis.    Right heart catheterization:  RA Pressure 10/12 Mean 8.mm Hg. RV: 50/-1 EDP 9 PA: 50/16 with mean of 29 mm Hg. PA saturation was 70% PCW: 24/33 with mean of 19 mm Hg. Aortic saturation:  99 Cardiac Output: 6.02, Cardiac Index: 2.88 by FICK method.    Femoral arteriogram: I performed or as I suspected I probably may have had deep femoral artery access. Femoral arteriogram did confirm my was in the deep femoral artery.  IMPRESSIONS:  1. Progression of coronary artery disease when compared to the cardiac catheterization in 2009 with high-grade stenosis of the mid LAD in the form of tandem 99% stenosis. There is a unprotected large D2 branch of the LAD which has ostial 90% stenosis. Today filling of the LAD is evident via LIMA to LAD. 2. New occlusion of the right coronary artery and native circumflex coronary artery. However these vessels supplied by vein graft which are widely patent.  3. Right heart cath revealing: Moderate pulmonary tension with preserved cardiac output and cardiac index  Recommendation: I initially attempted to proceed with PCI to the LAD and D2 branch of the LAD. However after he lies that I have profunda femoral artery access, a due to the  potential risk of bleeding, pseudoaneurysm formation I abandoned the procedure. Patient also has severe peripheral arterial disease. I plan to bring him back and perform angioplasty and elective fashion. A weighted the sheath out as soon as possible. Patient did receive less than a minute of Angiomax. No the complications were evident. 115 cc contrast used.  TECHNICAL PROCEDURE: Under sterile precautions using a 6-French right femoral access and a 7-French right femoral vein access, a balloon-tip Swan-Ganz catheter was advanced into the right atrium, right ventricle and then into the pulmonary capillary wedge position via right femoral venous access. Right-sided hemodynamics the carefully performed and the data was carefully analyzed. The catheter was then pulled out of the body.  A 5 [place] multipurpose B2 catheter was advanced via right femoral arterial access. The catheter was advanced into the left ventricle and left ventriculography was performed in the RAO projection after exchanging the catheter to a 5 French pigtail catheter using the exchange length J-wire. The left the program was performed after performing coronary arteriogram. Prior to advancing the catheter and exchanging to a pigtail I did utilize other catheters to perform selective angiography. I utilized 6 [place] JL 4 and JR 4 to engage selective right and left coronary arteries and the same JR 4 diagnostic catheter was utilized to engage the saphenous vein graft. The left intramammary artery was cannulated using a 5 [place] LIMA catheter which was advanced into the subclavian artery over a 0.035 inch Glidewire. The cath exchanges were all done over J-wire. Right femoral arteriogram was performed via right femoral arterial access. Manual pressure will be held for achieving hemostasis.

## 2011-10-09 ENCOUNTER — Encounter (HOSPITAL_COMMUNITY): Payer: Self-pay | Admitting: Respiratory Therapy

## 2011-10-09 MED FILL — Dextrose Inj 5%: INTRAVENOUS | Qty: 1000 | Status: AC

## 2011-10-15 ENCOUNTER — Encounter (HOSPITAL_COMMUNITY)
Admission: RE | Disposition: A | Discharge status: Home / Self Care | Payer: Self-pay | Source: Ambulatory Visit | Attending: Cardiology

## 2011-10-15 ENCOUNTER — Ambulatory Visit (HOSPITAL_COMMUNITY)
Admission: RE | Admit: 2011-10-15 | Discharge: 2011-10-16 | Disposition: A | Discharge status: Home / Self Care | Payer: Medicare Other | Source: Ambulatory Visit | Attending: Cardiology | Admitting: Cardiology

## 2011-10-15 ENCOUNTER — Encounter (HOSPITAL_COMMUNITY): Payer: Self-pay | Admitting: General Practice

## 2011-10-15 DIAGNOSIS — Z951 Presence of aortocoronary bypass graft: Secondary | ICD-10-CM | POA: Insufficient documentation

## 2011-10-15 DIAGNOSIS — E785 Hyperlipidemia, unspecified: Secondary | ICD-10-CM | POA: Insufficient documentation

## 2011-10-15 DIAGNOSIS — M216X9 Other acquired deformities of unspecified foot: Secondary | ICD-10-CM | POA: Insufficient documentation

## 2011-10-15 DIAGNOSIS — I2510 Atherosclerotic heart disease of native coronary artery without angina pectoris: Secondary | ICD-10-CM | POA: Insufficient documentation

## 2011-10-15 DIAGNOSIS — Z9861 Coronary angioplasty status: Secondary | ICD-10-CM

## 2011-10-15 DIAGNOSIS — I10 Essential (primary) hypertension: Secondary | ICD-10-CM | POA: Insufficient documentation

## 2011-10-15 DIAGNOSIS — R0602 Shortness of breath: Secondary | ICD-10-CM | POA: Insufficient documentation

## 2011-10-15 HISTORY — DX: Acute myocardial infarction, unspecified: I21.9

## 2011-10-15 HISTORY — DX: Shortness of breath: R06.02

## 2011-10-15 HISTORY — DX: Atherosclerotic heart disease of native coronary artery without angina pectoris: I25.10

## 2011-10-15 HISTORY — PX: PERCUTANEOUS CORONARY STENT INTERVENTION (PCI-S): SHX5485

## 2011-10-15 HISTORY — PX: CORONARY STENT PLACEMENT: SHX1402

## 2011-10-15 HISTORY — DX: Essential (primary) hypertension: I10

## 2011-10-15 LAB — CBC
HCT: 33 % — ABNORMAL LOW (ref 39.0–52.0)
Hemoglobin: 11.3 g/dL — ABNORMAL LOW (ref 13.0–17.0)
MCHC: 34.2 g/dL (ref 30.0–36.0)
MCV: 97.6 fL (ref 78.0–100.0)
RDW: 13 % (ref 11.5–15.5)
WBC: 5 10*3/uL (ref 4.0–10.5)

## 2011-10-15 LAB — BASIC METABOLIC PANEL
BUN: 19 mg/dL (ref 6–23)
Chloride: 102 mEq/L (ref 96–112)
Creatinine, Ser: 1.15 mg/dL (ref 0.50–1.35)
GFR calc Af Amer: 67 mL/min — ABNORMAL LOW (ref >90)
Glucose, Bld: 96 mg/dL (ref 70–99)

## 2011-10-15 LAB — POCT ACTIVATED CLOTTING TIME: Activated Clotting Time: 424 seconds

## 2011-10-15 SURGERY — PERCUTANEOUS CORONARY STENT INTERVENTION (PCI-S)
Anesthesia: LOCAL

## 2011-10-15 MED ORDER — ACETAMINOPHEN 325 MG PO TABS: 650.0000 mg | ORAL_TABLET | ORAL | Status: DC | PRN

## 2011-10-15 MED ORDER — NITROGLYCERIN 0.2 MG/ML ON CALL CATH LAB
INTRAVENOUS | Status: AC
  Filled 2011-10-15: qty 1

## 2011-10-15 MED ORDER — HEPARIN (PORCINE) IN NACL 2-0.9 UNIT/ML-% IJ SOLN
INTRAMUSCULAR | Status: AC
  Filled 2011-10-15: qty 2000

## 2011-10-15 MED ORDER — NIACIN ER 500 MG PO CPCR
500.0000 mg | ORAL_CAPSULE | Freq: Every day | ORAL | Status: DC
  Administered 2011-10-16: 500 mg via ORAL
  Filled 2011-10-15: qty 1

## 2011-10-15 MED ORDER — SODIUM CHLORIDE 0.9 % IV SOLN
INTRAVENOUS | Status: DC
  Administered 2011-10-15: 150 mL via INTRAVENOUS

## 2011-10-15 MED ORDER — LIDOCAINE HCL (PF) 1 % IJ SOLN
INTRAMUSCULAR | Status: AC
  Filled 2011-10-15: qty 30

## 2011-10-15 MED ORDER — LOSARTAN POTASSIUM 50 MG PO TABS
50.0000 mg | ORAL_TABLET | Freq: Every day | ORAL | Status: DC
  Administered 2011-10-16: 09:00:00 50 mg via ORAL
  Filled 2011-10-15 (×2): qty 1

## 2011-10-15 MED ORDER — SIMVASTATIN 40 MG PO TABS
40.0000 mg | ORAL_TABLET | Freq: Every day | ORAL | Status: DC
  Administered 2011-10-15: 22:00:00 40 mg via ORAL
  Filled 2011-10-15 (×2): qty 1

## 2011-10-15 MED ORDER — ASPIRIN 81 MG PO CHEW
324.0000 mg | CHEWABLE_TABLET | ORAL | Status: AC
  Administered 2011-10-15: 324 mg via ORAL
  Filled 2011-10-15: qty 4

## 2011-10-15 MED ORDER — SODIUM CHLORIDE 0.9 % IV SOLN
1.7500 mg/kg/h | INTRAVENOUS | Status: DC
  Filled 2011-10-15: qty 250

## 2011-10-15 MED ORDER — OXYCODONE-ACETAMINOPHEN 5-325 MG PO TABS: 1.0000 | ORAL_TABLET | ORAL | Status: DC | PRN

## 2011-10-15 MED ORDER — ASPIRIN 81 MG PO CHEW
81.0000 mg | CHEWABLE_TABLET | Freq: Every day | ORAL | Status: DC
  Administered 2011-10-16: 09:00:00 81 mg via ORAL
  Filled 2011-10-15: qty 1

## 2011-10-15 MED ORDER — MIDAZOLAM HCL 2 MG/2ML IJ SOLN
INTRAMUSCULAR | Status: AC
  Filled 2011-10-15: qty 2

## 2011-10-15 MED ORDER — SODIUM CHLORIDE 0.9 % IJ SOLN: 3.0000 mL | INTRAMUSCULAR | Status: DC | PRN

## 2011-10-15 MED ORDER — PRASUGREL HCL 10 MG PO TABS
60.0000 mg | ORAL_TABLET | Freq: Once | ORAL | Status: AC
  Administered 2011-10-15: 60 mg via ORAL
  Filled 2011-10-15 (×2): qty 6

## 2011-10-15 MED ORDER — HYDROMORPHONE HCL PF 2 MG/ML IJ SOLN
INTRAMUSCULAR | Status: AC
  Filled 2011-10-15: qty 1

## 2011-10-15 MED ORDER — CARVEDILOL 12.5 MG PO TABS
12.5000 mg | ORAL_TABLET | Freq: Two times a day (BID) | ORAL | Status: DC
  Administered 2011-10-15 – 2011-10-16 (×2): 12.5 mg via ORAL
  Filled 2011-10-15 (×3): qty 1

## 2011-10-15 MED ORDER — PRASUGREL HCL 5 MG PO TABS
5.0000 mg | ORAL_TABLET | Freq: Every day | ORAL | Status: DC
Start: 2011-10-16 — End: 2011-10-16
  Administered 2011-10-16: 5 mg via ORAL
  Filled 2011-10-15: qty 1

## 2011-10-15 MED ORDER — PRASUGREL HCL 10 MG PO TABS: 10.0000 mg | ORAL_TABLET | Freq: Every day | ORAL | Status: DC

## 2011-10-15 MED ORDER — ISOSORBIDE MONONITRATE ER 60 MG PO TB24
60.0000 mg | ORAL_TABLET | Freq: Every day | ORAL | Status: DC
  Administered 2011-10-16: 60 mg via ORAL
  Filled 2011-10-15: qty 1

## 2011-10-15 MED ORDER — SODIUM CHLORIDE 0.9 % IV SOLN
1.0000 mL/kg/h | INTRAVENOUS | Status: AC
  Administered 2011-10-15: 12:00:00 1 mL/kg/h via INTRAVENOUS

## 2011-10-15 MED ORDER — SODIUM CHLORIDE 0.9 % IV SOLN: 250.0000 mL | INTRAVENOUS | Status: DC | PRN

## 2011-10-15 MED ORDER — BIVALIRUDIN 250 MG IV SOLR
INTRAVENOUS | Status: AC
  Filled 2011-10-15: qty 250

## 2011-10-15 MED ORDER — SODIUM CHLORIDE 0.9 % IJ SOLN: 3.0000 mL | Freq: Two times a day (BID) | INTRAMUSCULAR | Status: DC

## 2011-10-15 MED ORDER — NITROGLYCERIN 0.4 MG SL SUBL: 0.4000 mg | SUBLINGUAL_TABLET | SUBLINGUAL | Status: DC | PRN

## 2011-10-15 MED ORDER — ONDANSETRON HCL 4 MG/2ML IJ SOLN: 4.0000 mg | Freq: Four times a day (QID) | INTRAMUSCULAR | Status: DC | PRN

## 2011-10-15 NOTE — Progress Notes (Signed)
TR BAND REMOVAL  LOCATION:    right radial  DEFLATED PER PROTOCOL:    yes  TIME BAND OFF / DRESSING APPLIED:    1430   SITE UPON ARRIVAL:    Level 0  SITE AFTER BAND REMOVAL:    Level 0  REVERSE ALLEN'S TEST:     positive  CIRCULATION SENSATION AND MOVEMENT:    Within Normal Limits   yes  COMMENTS:  Tolerated procedure well 

## 2011-10-15 NOTE — H&P (View-Only) (Signed)
  Please see paper chart  

## 2011-10-15 NOTE — CV Procedure (Addendum)
Procedure performed:  Left heart catheterization including hemodynamic monitoring of the left ventricle, selective right and left coronary arteriography.  Indication [name] is a 78 year old Caucasian male with history of known coronary artery disease and has CABG in 2005. He has LIMA to LAD, SVG to OM 1 and OM 2, stent in the proximal segment of this vein graft, SVG to PDA. SVG to D1 is occluded. He now presents with chest pain which was very typical for exertional angina pectoris which was lifestyle limiting associated with worsening shortness of breath. He had undergone cardiac catheterization on 10/08/2011 and was found to have progression of native vessel coronary artery disease involving the proximal mid LAD and a very large diagonal to ostial stenosis. Because of the complexity of the coronary artery anatomy and due to the fact that patient has had right profunda femoral artery access to avoid pseudoaneurysm and bleeding competitions and contrast nephropathy I had staged procedure. He is now brought in for elective PTCA. Please see my dictation dated 10/08/2011 for diagnostic coronary angiography.   Interventional data: Complex and prolonged procedure do to complex coronary anatomy. Multiple guidewires, balloons and stents had to be utilized  Successful PTCA and stenting of the D2 branch of the LAD with overlapping  2.5 x 23 mm and 2.75 x 12 mm Xience Xpedition stent. Stenosis reduced from 95% to 0% with TIMI-3 to TIMI-3 flow maintained. Successful PTCA and stenting of the mid LAD and proximal LAD with implantation of a 3.5 x 28 mm Xience Xpedition stent. Post dilated with 3.75 x 15 mm [place] sprinter. Stenosis reduced from 95% to less than 10%. There was angiographic complaint a position to the vessel wall however due to severe calcification the lesion was left alone.  Technique of intervention:  Using a 6 French right radial arterial access and Angiomax for anticoagulation,  intervention was performed. Using a 6 [place] XB 3.0 with [facility] guide catheter the LM  coronary  was selected and cannulated. Using Angiomax for anticoagulation, I utilized a [place]ouger guidewire and across the LAD and D2 coronary artery with moderate difficulty. I placed the tip of the wire into the distal  coronary artery. Angiography was performed.   Then I utilized a 3.0x20 Sprinter Legend  balloon , I was unable to cross the diagonal 2 stenosis. I performed balloon angioplasty of the mid and proximal LAD at 12-16 atm pressure x 5 for 30-45 seconds each.  Then I utilized a 2.0 x 15 mm splinter Legend to perform balloon angioplasty at the D2 site and again had difficulty crossing the stenosis. Multiple balloon antiplastic to perform at 16 atmospheric pressure x4 and then I attempted to implant a 2.5 x 23 mm Xience Xpedition stent. Due to the inability to advance the stent in spite of guide manipulation, I utilized a 2.75 x 16 mm [place] treck and balloon angioplasty was performed at 16 atmospheric pressure x2 for 35 and 50 seconds.   then I advanced the Xience stent to the diagonal vessel and again had difficulty. After multiple balloon inflations which was repeated with a 2.75 x 16 mm noncompliant balloon, I also utilized a whisper guidewire and this time at greatest width there was able to advance the stent to the midsegment of the D2 and deployed the stent at peak of 16 atmospheric pressure. The same stent balloon was utilized to perform multiple balloon angioplasty into the proximal D2 and into the midsegment of the LAD to facilitate proximal D2 stent implantation. A 2.75  x 12 mm Xience Xpedition stent was advanced to the site of proximal diagonal 2 stenosis  And stent was deployed at 16 atmospheric pressure x2 at 50 seconds each. The buddy wire that was placed i.e. whisper wire was utilized throughout angioplasty. The cougar guidewire was withdrawn after the stent implantation without much difficulty. Then I  proceeded for angioplasty to the proximal and mid LAD.    I proceeded with implantation of a 3.5 x 28 mm Xience Xpedition stent  drug-eluting stent into the Proximal and Mid LAD coronary artery. The stent was deployed at peak of 16 atmospheric pressure for 30-45  Seconds x 5. The stent was then post dilated with a 3.75x15 mm [place] Sprinter balloon at 16 atm x 3 for 45 Seconds each. Post-balloon angioplasty results were excellent with <10% residual stenoses and TIMI-3 flow was maintained. There was no evidence of edge dissection. The guidewire was withdrawn out of the body and the guide catheter was engaged and pulled out of the body over the J-wire the was no immediate complication. Patient tolerated the procedure well.  A  total of 205 cc of contrast was utilized for diagnostic and intervention procedure.  Disposition: Patient will be discharged in AM unless complications with out-patient follow up. Will need Dual antiplatelet therapy with Effient and ASA 81 mg for at least one year.

## 2011-10-15 NOTE — Interval H&P Note (Signed)
History and Physical Interval Note:  10/15/2011 8:54 AM  Craig Parks  has presented today for surgery, with the diagnosis of cad  The various methods of treatment have been discussed with the patient and family. After consideration of risks, benefits and other options for treatment, the patient has consented to  Procedure(s) (LRB): PERCUTANEOUS CORONARY STENT INTERVENTION (PCI-S) (N/A) as a surgical intervention .  The patient's history has been reviewed, patient examined, no change in status, stable for surgery.  I have reviewed the patient's chart and labs.  Questions were answered to the patient's satisfaction.     [name]

## 2011-10-16 DIAGNOSIS — Z9861 Coronary angioplasty status: Secondary | ICD-10-CM

## 2011-10-16 LAB — CBC
HCT: 29.8 % — ABNORMAL LOW (ref 39.0–52.0)
MCH: 33.7 pg (ref 26.0–34.0)
MCV: 97.4 fL (ref 78.0–100.0)
Platelets: 132 10*3/uL — ABNORMAL LOW (ref 150–400)
RDW: 13.1 % (ref 11.5–15.5)
WBC: 7.5 10*3/uL (ref 4.0–10.5)

## 2011-10-16 LAB — BASIC METABOLIC PANEL
BUN: 16 mg/dL (ref 6–23)
Calcium: 8.9 mg/dL (ref 8.4–10.5)
Chloride: 105 mEq/L (ref 96–112)
Creatinine, Ser: 1.09 mg/dL (ref 0.50–1.35)
GFR calc Af Amer: 72 mL/min — ABNORMAL LOW (ref >90)

## 2011-10-16 MED ORDER — ASPIRIN 81 MG PO CHEW: 81.0000 mg | CHEWABLE_TABLET | Freq: Every day | ORAL | Status: DC

## 2011-10-16 MED ORDER — PRASUGREL HCL 5 MG PO TABS: 5.0000 mg | ORAL_TABLET | Freq: Every day | ORAL | Status: DC

## 2011-10-16 NOTE — Discharge Summary (Addendum)
Physician Discharge Summary  Patient ID: Craig Parks MRN: [id number] DOB/AGE: 09/09/1929 78 y.o.  Admit date: 10/15/2011 Discharge date: 10/16/2011  Primary Discharge Diagnosis CAD of native vessel Angina pectoris Dyspnea. Secondary Discharge Diagnosis CABG status Hyperlipidemia, mixed. Hypertension. Spinal stenosis and foot drop.  Significant Diagnostic Studies: 10/15/11 PTCA and stenting of the D2 branch of the LAD with overlapping 2.5 x 23 mm and 2.75 x 12 mm Xience Xpedition stent.  PTCA and stenting of the mid LAD and proximal LAD with implantation of a 3.5 x 28 mm 2.5 x 23 mm Xience Xpedition stent. Post dilated with 3.75 x 15 mm [place] sprinter. H/O Stenting to SVG to OM1 and 2 in past.  Consults:   Hospital Course: Patient was admitted to the hospital an elective basis for elective coronary angioplasty. He underwent diagnostic coronary angiography week ago and was found to have high-grade and complex proximal LAD and diagonal 2 disease. He underwent coronary angiography and angioplasty without any significant complications. The following day he was walking with the help of cardiac rehabilitation and in fact felt markedly better and his shortness of breath had improved. Felt stable for discharge.   Discharge Exam: Blood pressure 129/67, pulse 58, temperature 98.1 F (36.7 C), temperature source Oral, resp. rate 21, height 5' 8.5" (1.74 m), weight 90.4 kg (199 lb 4.7 oz), SpO2 95.00%.    General appearance: alert, cooperative, appears stated age and no distress Resp: clear to auscultation bilaterally Cardio: regular rate and rhythm, S1, S2 normal, no murmur, click, rub or gallop Extremities: extremities normal, atraumatic, no cyanosis or edema and Raight radial access site good. Previous access at right femoral site shows mild ecchymosis. Neurologic: Alert and oriented X 3, normal strength and tone. Normal symmetric reflexes. Normal coordination and gait Labs:   Lab Results    Component Value Date   WBC 7.5 10/16/2011   HGB 10.3* 10/16/2011   HCT 29.8* 10/16/2011   MCV 97.4 10/16/2011   PLT 132* 10/16/2011    Lab 10/16/11 0500  NA 140  K 3.7  CL 105  CO2 23  BUN 16  CREATININE 1.09  CALCIUM 8.9  PROT --  BILITOT --  ALKPHOS --  ALT --  AST --  GLUCOSE 103*      EKG:S. Bradycardia. LVH, PVC. No ischemia. No change from baseline EKG.  FOLLOW UP PLANS AND APPOINTMENTS  Medication List  As of 10/16/2011  8:41 AM   STOP taking these medications         aspirin 325 MG tablet         TAKE these medications         aspirin 81 MG chewable tablet   Chew 1 tablet (81 mg total) by mouth daily.      carvedilol 12.5 MG tablet   Commonly known as: COREG   Take 12.5 mg by mouth 2 (two) times daily with a meal.      folic acid 800 MCG tablet   Commonly known as: FOLVITE   Take 400 mcg by mouth at bedtime.      isosorbide mononitrate 60 MG 24 hr tablet   Commonly known as: IMDUR   Take 60 mg by mouth daily.      losartan 50 MG tablet   Commonly known as: COZAAR   Take 50 mg by mouth daily.      niacin 500 MG CR capsule   Take 500 mg by mouth daily.      nitroGLYCERIN 0.4 MG  SL tablet   Commonly known as: NITROSTAT   Place 0.4 mg under the tongue every 5 (five) minutes as needed. For chest pain      prasugrel 5 MG Tabs   Commonly known as: EFFIENT   Take 1 tablet (5 mg total) by mouth daily.      simvastatin 40 MG tablet   Commonly known as: ZOCOR   Take 40 mg by mouth at bedtime.           Follow-up Information    Follow up with [name], MD. (Aug 6th. Keep previous appointment.)    Contact information:   [address]  [place] [address] [phone]           [name], MD 10/16/2011, 8:41 AM

## 2011-10-16 NOTE — Progress Notes (Signed)
CARDIAC REHAB PHASE I   PRE:  Rate/Rhythm: 58 SB  BP:  Supine:   Sitting: 129/67  Standing:    SaO2:   MODE:  Ambulation: 500 ft   POST:  Rate/Rhythem: 72 SR  BP:  Supine:   Sitting: 124/38  Standing:    SaO2:  0800-0850 Assisted X 1 and used walker to ambulate.Gait steady with walker. Pt walked 500 feet without c/o of cp or SOB. VS stable. Pt back to recliner after walk with call light in reach.Completed stent discharge education with pt. He declines Outpt. CRP due to the need to be with wife. She has dementia and he is unable to leave her and his son has terminal brain cancer.  Beatrix Fetters

## 2012-04-17 ENCOUNTER — Encounter (HOSPITAL_COMMUNITY): Payer: Self-pay | Admitting: *Deleted

## 2012-04-17 ENCOUNTER — Emergency Department (HOSPITAL_COMMUNITY)
Admission: EM | Admit: 2012-04-17 | Discharge: 2012-04-17 | Disposition: A | Discharge status: Home / Self Care | Payer: Medicare Other | Attending: Emergency Medicine | Admitting: Emergency Medicine

## 2012-04-17 DIAGNOSIS — S51819A Laceration without foreign body of unspecified forearm, initial encounter: Secondary | ICD-10-CM

## 2012-04-17 DIAGNOSIS — Z79899 Other long term (current) drug therapy: Secondary | ICD-10-CM | POA: Insufficient documentation

## 2012-04-17 DIAGNOSIS — S59909A Unspecified injury of unspecified elbow, initial encounter: Secondary | ICD-10-CM | POA: Insufficient documentation

## 2012-04-17 DIAGNOSIS — S59919A Unspecified injury of unspecified forearm, initial encounter: Secondary | ICD-10-CM | POA: Insufficient documentation

## 2012-04-17 DIAGNOSIS — Y929 Unspecified place or not applicable: Secondary | ICD-10-CM | POA: Insufficient documentation

## 2012-04-17 DIAGNOSIS — I252 Old myocardial infarction: Secondary | ICD-10-CM | POA: Insufficient documentation

## 2012-04-17 DIAGNOSIS — Y9301 Activity, walking, marching and hiking: Secondary | ICD-10-CM | POA: Insufficient documentation

## 2012-04-17 DIAGNOSIS — W1809XA Striking against other object with subsequent fall, initial encounter: Secondary | ICD-10-CM | POA: Insufficient documentation

## 2012-04-17 DIAGNOSIS — S6990XA Unspecified injury of unspecified wrist, hand and finger(s), initial encounter: Secondary | ICD-10-CM | POA: Insufficient documentation

## 2012-04-17 DIAGNOSIS — W010XXA Fall on same level from slipping, tripping and stumbling without subsequent striking against object, initial encounter: Secondary | ICD-10-CM | POA: Insufficient documentation

## 2012-04-17 DIAGNOSIS — I10 Essential (primary) hypertension: Secondary | ICD-10-CM | POA: Insufficient documentation

## 2012-04-17 DIAGNOSIS — R58 Hemorrhage, not elsewhere classified: Secondary | ICD-10-CM

## 2012-04-17 DIAGNOSIS — Z7982 Long term (current) use of aspirin: Secondary | ICD-10-CM | POA: Insufficient documentation

## 2012-04-17 DIAGNOSIS — Z9861 Coronary angioplasty status: Secondary | ICD-10-CM | POA: Insufficient documentation

## 2012-04-17 DIAGNOSIS — Z87891 Personal history of nicotine dependence: Secondary | ICD-10-CM | POA: Insufficient documentation

## 2012-04-17 DIAGNOSIS — I2510 Atherosclerotic heart disease of native coronary artery without angina pectoris: Secondary | ICD-10-CM | POA: Insufficient documentation

## 2012-04-17 LAB — CBC
HCT: 29.5 % — ABNORMAL LOW (ref 39.0–52.0)
MCV: 97.4 fL (ref 78.0–100.0)
RDW: 13.1 % (ref 11.5–15.5)
WBC: 6.4 10*3/uL (ref 4.0–10.5)

## 2012-04-17 MED ORDER — GELATIN ABSORBABLE 12-7 MM EX MISC
1.0000 | Freq: Once | CUTANEOUS | Status: AC
  Administered 2012-04-17: 1 via TOPICAL
  Filled 2012-04-17 (×2): qty 1

## 2012-04-17 NOTE — ED Notes (Signed)
Pt states he was taking the trash out this past Monday, became dizzy and tripped up the stairs and injured his left arm. Pt has a skin tear approximately 2.5 inches in length that is actively bleeding due being on Effient. Pt saw his cardiologist and PCP this week and was told to go to a wound specialist or the ED. Dressing changed 30 minutes prior to coming to the ER. Dressing soaked with blood. Left arm rewrapped. Pt denies dizziness, light headedness at this time. Does report a nose bleed earlier this week. Pt alert and oriented x 4, neuro intact.

## 2012-04-17 NOTE — ED Provider Notes (Signed)
History     CSN: [id number]  Arrival date & time 04/17/12  1116   First MD Initiated Contact with Patient 04/17/12 1135      Chief Complaint  Patient presents with  . Arm Injury    (Consider location/radiation/quality/duration/timing/severity/associated sxs/prior treatment) HPI Pt presents with skin tear to his left forearm.  He fell while taking out the trash 5 days ago and suffered skin tear.  He takes effient due to having coronary stents.  He has no other injuries or complaints of pain.  Did not strike head.  He stopped taking the effient, then saw his cardiologist this week who recommended continuing it.  He saw PMD today and they re-wrapped the area and it continued to bleed so sent to the ED for evaluation.  Pt denies pain, no fainting, no chest pain or shortness of breath.  There are no other associated systemic symptoms, there are no other alleviating or modifying factors.   Past Medical History  Diagnosis Date  . Coronary artery disease   . Myocardial infarction   . Hypertension   . Shortness of breath     Past Surgical History  Procedure Date  . Coronary stent placement 10/15/2011    DES  to  mid & proximal LAD  . Ablation of dysrhythmic focus   . Cardiac catheterization 09/2011  . Coronary artery bypass graft 2005  . Back surgery     No family history on file.  History  Substance Use Topics  . Smoking status: Former Smoker    Quit date: 11/15/1971  . Smokeless tobacco: Never Used  . Alcohol Use: 3.6 oz/week    4 Cans of beer, 2 Shots of liquor per week     Comment: DAILY      Review of Systems ROS reviewed and all otherwise negative except for mentioned in HPI  Allergies  Amlodipine besylate and Clopidogrel bisulfate  Home Medications   Current Outpatient Rx  Name  Route  Sig  Dispense  Refill  . ASPIRIN 81 MG PO CHEW   Oral   Chew 81 mg by mouth daily.         Marland Kitchen CARVEDILOL 12.5 MG PO TABS   Oral   Take 12.5 mg by mouth 2 (two) times daily  with a meal.         . ISOSORBIDE MONONITRATE ER 60 MG PO TB24   Oral   Take 60 mg by mouth daily.         Marland Kitchen NIACIN ER 500 MG PO CPCR   Oral   Take 500 mg by mouth daily.         Marland Kitchen NITROGLYCERIN 0.4 MG SL SUBL   Sublingual   Place 0.4 mg under the tongue every 5 (five) minutes as needed. For chest pain         . FISH OIL PO   Oral   Take 1 capsule by mouth 2 (two) times daily.         Marland Kitchen PRASUGREL HCL 5 MG PO TABS   Oral   Take 5 mg by mouth daily.         Marland Kitchen SIMVASTATIN 40 MG PO TABS   Oral   Take 40 mg by mouth at bedtime.         . SPIRONOLACTONE 25 MG PO TABS   Oral   Take 25 mg by mouth daily.           BP 94/56  Pulse 50  Temp 97.8 F (36.6 C) (Oral)  Resp 16  SpO2 100% Vitals reviewed Physical Exam Physical Examination: General appearance - alert, well appearing, and in no distress Mental status - alert, oriented to person, place, and time Eyes - no conjunctival injection, no scleral icterus Chest - clear to auscultation, no wheezes, rales or rhonchi, symmetric air entry Heart - normal rate, regular rhythm, normal S1, S2, no murmurs, rubs, clicks or gallops Extremities - peripheral pulses normal, no pedal edema, no clubbing or cyanosis Skin - normal coloration and turgor, skin tear approx 1cm by 5cm on left forearm with surrounding ecchymosis  ED Course  Procedures (including critical care time)  Labs Reviewed  CBC - Abnormal; Notable for the following:    RBC 3.03 (*)     Hemoglobin 10.2 (*)     HCT 29.5 (*)     Platelets 134 (*)     All other components within normal limits  PROTIME-INR   No results found.   1. Skin tear of forearm without complication   2. Bleeding       MDM  Pt presenting with c/o skin tear on his left arm that has continued to ooze blood over the past 5 days. Gelfoam applied and wound re-dressed.  Pt has mild anemia- stable hgb from prior.   No signs of surrounding infection.  BP somewhat low, however pt  is asymptomatic with this and hgb stable.  Discharged with strict return precautions.  Pt agreeable with plan.        Ethelda Chick, MD 04/17/12 1356

## 2012-12-02 ENCOUNTER — Encounter: Payer: Self-pay | Admitting: Radiology

## 2012-12-02 DIAGNOSIS — I509 Heart failure, unspecified: Secondary | ICD-10-CM | POA: Insufficient documentation

## 2013-06-01 ENCOUNTER — Other Ambulatory Visit: Payer: Self-pay | Admitting: Cardiology

## 2013-06-01 DIAGNOSIS — L988 Other specified disorders of the skin and subcutaneous tissue: Secondary | ICD-10-CM

## 2013-06-04 ENCOUNTER — Ambulatory Visit
Admission: RE | Admit: 2013-06-04 | Discharge: 2013-06-04 | Disposition: A | Discharge status: Home / Self Care | Payer: Medicare Other | Source: Ambulatory Visit | Attending: Cardiology | Admitting: Cardiology

## 2013-06-04 DIAGNOSIS — L988 Other specified disorders of the skin and subcutaneous tissue: Secondary | ICD-10-CM

## 2013-06-21 ENCOUNTER — Encounter (HOSPITAL_COMMUNITY): Payer: Self-pay | Admitting: Pharmacy Technician

## 2013-07-06 ENCOUNTER — Encounter (HOSPITAL_COMMUNITY)
Admission: RE | Disposition: A | Discharge status: Home / Self Care | Payer: Self-pay | Source: Ambulatory Visit | Attending: Cardiology

## 2013-07-06 ENCOUNTER — Ambulatory Visit (HOSPITAL_COMMUNITY)
Admission: RE | Admit: 2013-07-06 | Discharge: 2013-07-06 | Disposition: A | Discharge status: Home / Self Care | Payer: Medicare Other | Source: Ambulatory Visit | Attending: Cardiology | Admitting: Cardiology

## 2013-07-06 DIAGNOSIS — Z951 Presence of aortocoronary bypass graft: Secondary | ICD-10-CM | POA: Insufficient documentation

## 2013-07-06 DIAGNOSIS — Z9861 Coronary angioplasty status: Secondary | ICD-10-CM | POA: Insufficient documentation

## 2013-07-06 DIAGNOSIS — I770 Arteriovenous fistula, acquired: Secondary | ICD-10-CM | POA: Insufficient documentation

## 2013-07-06 DIAGNOSIS — I2510 Atherosclerotic heart disease of native coronary artery without angina pectoris: Secondary | ICD-10-CM | POA: Insufficient documentation

## 2013-07-06 HISTORY — PX: LOWER EXTREMITY ANGIOGRAM: SHX5508

## 2013-07-06 SURGERY — ANGIOGRAM, LOWER EXTREMITY
Anesthesia: LOCAL

## 2013-07-06 MED ORDER — SODIUM CHLORIDE 0.9 % IV SOLN
INTRAVENOUS | Status: DC
  Administered 2013-07-06: 09:00:00 via INTRAVENOUS

## 2013-07-06 MED ORDER — HYDRALAZINE HCL 20 MG/ML IJ SOLN
INTRAMUSCULAR | Status: AC
  Filled 2013-07-06: qty 1

## 2013-07-06 MED ORDER — HYDROMORPHONE HCL PF 1 MG/ML IJ SOLN
INTRAMUSCULAR | Status: AC
  Filled 2013-07-06: qty 1

## 2013-07-06 MED ORDER — LIDOCAINE HCL (PF) 1 % IJ SOLN
INTRAMUSCULAR | Status: AC
  Filled 2013-07-06: qty 30

## 2013-07-06 MED ORDER — SODIUM CHLORIDE 0.9 % IV SOLN: 1.0000 mL/kg/h | INTRAVENOUS | Status: DC

## 2013-07-06 MED ORDER — MIDAZOLAM HCL 2 MG/2ML IJ SOLN
INTRAMUSCULAR | Status: AC
Start: 2013-07-06 — End: 2013-07-06
  Filled 2013-07-06: qty 2

## 2013-07-06 MED ORDER — LABETALOL HCL 5 MG/ML IV SOLN
INTRAVENOUS | Status: AC
  Filled 2013-07-06: qty 4

## 2013-07-06 MED ORDER — HEPARIN (PORCINE) IN NACL 2-0.9 UNIT/ML-% IJ SOLN
INTRAMUSCULAR | Status: AC
  Filled 2013-07-06: qty 1000

## 2013-07-06 NOTE — CV Procedure (Signed)
Procedures performed: Left femoral arterial access. Abdominal aortogram. Limited abdominal aortogram with bifemoral runoff. Crossover from left femoral artery into the right femoral artery and placement of catheter tip in the right external iliac artery. Right iliac arteriogram distal runoff.  Indication: Patient is a 80 year old Caucasian male with history of known coronary artery disease and history of CABG and also angioplasty in the past. On recent physical examination he developed a abnormal physical exam with continuous bruit in his right groin suggest of AV fistula. I had obtained outpatient lower extremity arterial duplex, this was essentially normal without evidence of AV fistula. However due to clinical suspicion of AV fistula, also to exclude any significant pseudoaneurysm or AV malformation in the pelvis, he was brought to the peripheral angiography suite to evaluate his peripheral anatomy.  Angiographic data: Abdominal aorta is normal.  2 renal arteries on either sides. Mesenteric vessels are widely patent. Aortoiliac bifurcation was widely patent. The iliac vessels and internal iliac arteries are normal bilaterally.  Right femoral arteriogram: This revealed a AV fistula with rapid and brisk filling of the right femoral vein. The fistula appeared to come from right common femoral artery. There was no pseudoaneurysm. 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 I will continue to follow this clinically, patient asymptomatic, further discussion and outpatient visit.  Technique: Under sterile precautions using a 5 French left femoral arterial access a 5 French Omni Flush catheter was advanced into the abdominal aorta and abdominal aortogram was performed. Distal abdominal aortogram and limited bilateral femoral arteriogram was also performed with the same catheter. A 0.035 inch Versacore wire was utilized for performing catheter  exchanges. Then using the same catheter I was able to cross from the left femoral artery the right femoral artery and using the Glidewire, 0.035 inch, I advanced a 5 [place] end hole catheter into the right external iliac artery and right femoral arteriogram was performed in multiple views. The catheter then pulled out of the body. Hemostasis will be obtained by applying manual pressure. Patient tolerated the procedure. No immediate competitions.

## 2013-07-06 NOTE — H&P (Signed)
  Please see office visit notes for complete details of HPI.  Patient recently had a fall and has bruising on his right shoulder, anterior chest wall. No obvious fracture. Otherwise no change in his history and physical exam.

## 2013-07-06 NOTE — Discharge Instructions (Signed)
Arteriogram °Care After °These instructions give you information on caring for yourself after your procedure. Your doctor may also give you more specific instructions. Call your doctor if you have any problems or questions after your procedure. °HOME CARE °· Stay in bed the rest of the day. °· Keep your leg straight for at least 6 hours. °· Do not lift anything heavier than 10 pounds (about a gallon of milk) for 2 days. °· Do not walk a lot, run, or drive for 2 days. °· Return to normal activities in 2 days or as told by your doctor. °Finding out the results of your test °Ask when your test results will be ready. Make sure you get your test results. °GET HELP RIGHT AWAY IF:  °· You have fever of 102° F (38.9° C) or higher. °· You have more pain in your leg. °· The leg that was cut is: °· Bleeding. °· Puffy (swollen) or red. °· Cold. °· Pale or changes color. °· Weak. °· Tingly or numb. °If you go to the Emergency Room, tell your nurse that you have had an arteriogram. Take this paper with you to show the nurse. °MAKE SURE YOU: °· Understand these instructions. °· Will watch your condition. °· Will get help right away if you are not doing well or get worse. °Document Released: 05/31/2008 Document Revised: 05/27/2011 Document Reviewed: 05/31/2008 °[employer]® Patient Information ©2014 [employer]. ° °

## 2013-07-06 NOTE — Interval H&P Note (Signed)
History and Physical Interval Note:  07/06/2013 12:00 PM  Craig Parks  has presented today for surgery, with the diagnosis of AV Fistula  The various methods of treatment have been discussed with the patient and family. After consideration of risks, benefits and other options for treatment, the patient has consented to  Procedure(s): LOWER EXTREMITY ANGIOGRAM (N/A) and possible PTA  as a surgical intervention .  The patient's history has been reviewed, patient examined, no change in status, stable for surgery.  I have reviewed the patient's chart and labs.  Questions were answered to the patient's satisfaction.     Pamella PertJagadeesh R Roxy Filler

## 2013-08-03 ENCOUNTER — Other Ambulatory Visit: Payer: Self-pay | Admitting: Cardiology

## 2013-08-03 DIAGNOSIS — R188 Other ascites: Secondary | ICD-10-CM

## 2013-08-03 DIAGNOSIS — K7460 Unspecified cirrhosis of liver: Secondary | ICD-10-CM

## 2013-08-05 ENCOUNTER — Ambulatory Visit
Admission: RE | Admit: 2013-08-05 | Discharge: 2013-08-05 | Disposition: A | Discharge status: Home / Self Care | Payer: Medicare Other | Source: Ambulatory Visit | Attending: Cardiology | Admitting: Cardiology

## 2013-08-05 DIAGNOSIS — R188 Other ascites: Secondary | ICD-10-CM

## 2013-08-05 DIAGNOSIS — K7460 Unspecified cirrhosis of liver: Secondary | ICD-10-CM

## 2013-09-13 ENCOUNTER — Encounter: Payer: Self-pay | Admitting: *Deleted

## 2013-09-15 ENCOUNTER — Other Ambulatory Visit: Payer: Self-pay | Admitting: Internal Medicine

## 2013-09-15 ENCOUNTER — Ambulatory Visit
Admission: RE | Admit: 2013-09-15 | Discharge: 2013-09-15 | Disposition: A | Discharge status: Home / Self Care | Payer: Medicare Other | Source: Ambulatory Visit | Attending: Internal Medicine | Admitting: Internal Medicine

## 2013-09-15 DIAGNOSIS — R17 Unspecified jaundice: Secondary | ICD-10-CM

## 2013-09-15 DIAGNOSIS — R6883 Chills (without fever): Secondary | ICD-10-CM

## 2013-09-15 DIAGNOSIS — R109 Unspecified abdominal pain: Secondary | ICD-10-CM

## 2013-09-15 DIAGNOSIS — F1010 Alcohol abuse, uncomplicated: Secondary | ICD-10-CM

## 2013-09-15 MED ORDER — IOHEXOL 300 MG/ML  SOLN
100.0000 mL | Freq: Once | INTRAMUSCULAR | Status: AC | PRN
  Administered 2013-09-15: 100 mL via INTRAVENOUS

## 2013-09-20 ENCOUNTER — Telehealth: Payer: Self-pay | Admitting: Gastroenterology

## 2013-09-20 NOTE — Telephone Encounter (Signed)
Pt scheduled to see Dr. Arlyce DiceKaplan 09/22/13@9 :45am. Left message for Selena BattenKim to call back.

## 2013-09-20 NOTE — Telephone Encounter (Signed)
Kim called back and I gave her the appointment information. Says she will let the patient know.

## 2013-09-22 ENCOUNTER — Encounter: Payer: Self-pay | Admitting: Gastroenterology

## 2013-09-22 ENCOUNTER — Other Ambulatory Visit (INDEPENDENT_AMBULATORY_CARE_PROVIDER_SITE_OTHER): Payer: Medicare Other

## 2013-09-22 ENCOUNTER — Ambulatory Visit (INDEPENDENT_AMBULATORY_CARE_PROVIDER_SITE_OTHER): Payer: Medicare Other | Admitting: Gastroenterology

## 2013-09-22 VITALS — BP 118/60 | HR 56 | Ht 68.5 in | Wt 189.5 lb

## 2013-09-22 DIAGNOSIS — K861 Other chronic pancreatitis: Secondary | ICD-10-CM

## 2013-09-22 LAB — CBC WITH DIFFERENTIAL/PLATELET
BASOS ABS: 0 10*3/uL (ref 0.0–0.1)
Basophils Relative: 0.3 % (ref 0.0–3.0)
EOS ABS: 0.2 10*3/uL (ref 0.0–0.7)
Eosinophils Relative: 3.2 % (ref 0.0–5.0)
HCT: 28.3 % — ABNORMAL LOW (ref 39.0–52.0)
Hemoglobin: 9.5 g/dL — ABNORMAL LOW (ref 13.0–17.0)
LYMPHS PCT: 12.3 % (ref 12.0–46.0)
Lymphs Abs: 0.9 10*3/uL (ref 0.7–4.0)
MCHC: 33.6 g/dL (ref 30.0–36.0)
MCV: 102.3 fl — ABNORMAL HIGH (ref 78.0–100.0)
MONOS PCT: 10.1 % (ref 3.0–12.0)
Monocytes Absolute: 0.7 10*3/uL (ref 0.1–1.0)
NEUTROS PCT: 74.1 % (ref 43.0–77.0)
Neutro Abs: 5.4 10*3/uL (ref 1.4–7.7)
Platelets: 167 10*3/uL (ref 150.0–400.0)
RBC: 2.77 Mil/uL — ABNORMAL LOW (ref 4.22–5.81)
RDW: 14.8 % (ref 11.5–15.5)
WBC: 7.2 10*3/uL (ref 4.0–10.5)

## 2013-09-22 LAB — LIPASE: Lipase: 71 U/L — ABNORMAL HIGH (ref 11.0–59.0)

## 2013-09-22 LAB — COMPREHENSIVE METABOLIC PANEL
ALK PHOS: 135 U/L — AB (ref 39–117)
ALT: 18 U/L (ref 0–53)
AST: 26 U/L (ref 0–37)
Albumin: 2.8 g/dL — ABNORMAL LOW (ref 3.5–5.2)
BUN: 32 mg/dL — ABNORMAL HIGH (ref 6–23)
CO2: 24 mEq/L (ref 19–32)
Calcium: 8.9 mg/dL (ref 8.4–10.5)
Chloride: 98 mEq/L (ref 96–112)
Creatinine, Ser: 1.4 mg/dL (ref 0.4–1.5)
GFR: 52.41 mL/min — ABNORMAL LOW (ref >60.00)
Glucose, Bld: 180 mg/dL — ABNORMAL HIGH (ref 70–99)
Potassium: 3.9 mEq/L (ref 3.5–5.1)
SODIUM: 133 meq/L — AB (ref 135–145)
TOTAL PROTEIN: 7.3 g/dL (ref 6.0–8.3)
Total Bilirubin: 2.2 mg/dL — ABNORMAL HIGH (ref 0.2–1.2)

## 2013-09-22 LAB — AMYLASE: Amylase: 122 U/L (ref 27–131)

## 2013-09-22 LAB — PROTIME-INR
INR: 1.5 ratio — ABNORMAL HIGH (ref 0.8–1.0)
PROTHROMBIN TIME: 16.6 s — AB (ref 9.6–13.1)

## 2013-09-22 NOTE — Progress Notes (Signed)
_                                                                                                                History of Present Illness: 80 year old white male referred for evaluation of an abnormal CT and ultrasound.  Mild lower bowel pain prompted an ultrasound and then CT, which I reviewed, that demonstrated diffuse dilatation of the pancreatic duct and multiple areas of calcification.  There was some peri-hepatic ascites.  Patient's only complaint is poor appetite.  In 2010 he had gallstone pancreatitis.Craig Parks.  He admits to lifelong heavy alcohol use.  Weight has been stable.  There is no history of hepatitis.    Past Medical History  Diagnosis Date  . Coronary artery disease   . Myocardial infarction   . Hypertension   . Shortness of breath    Past Surgical History  Procedure Laterality Date  . Coronary stent placement  10/15/2011    DES  to  mid & proximal LAD  . Ablation of dysrhythmic focus    . Cardiac catheterization  09/2011  . Coronary artery bypass graft  2005  . Back surgery     family history includes Colon cancer in an other family member. Current Outpatient Prescriptions  Medication Sig Dispense Refill  . aspirin 325 MG tablet Take 325 mg by mouth at bedtime.      . carvedilol (COREG) 12.5 MG tablet Take 12.5 mg by mouth 2 (two) times daily with a meal.      . isosorbide mononitrate (IMDUR) 60 MG 24 hr tablet Take 60 mg by mouth daily.      . niacin 500 MG CR capsule Take 500 mg by mouth daily.      . nitroGLYCERIN (NITROSTAT) 0.4 MG SL tablet Place 0.4 mg under the tongue every 5 (five) minutes as needed. For chest pain      . Omega-3 Fatty Acids (FISH OIL PO) Take 1 capsule by mouth 2 (two) times daily.      . simvastatin (ZOCOR) 40 MG tablet Take 40 mg by mouth at bedtime.      Craig Parks. spironolactone (ALDACTONE) 25 MG tablet Take 25 mg by mouth daily as needed (swelling).        No current facility-administered medications for this visit.   Allergies  as of 09/22/2013 - Review Complete 09/22/2013  Allergen Reaction Noted  . Amlodipine besylate Other (See Comments)   . Clopidogrel bisulfate Hives   . Norvasc [amlodipine besylate]  09/22/2013  . Plavix [clopidogrel bisulfate]  09/22/2013    reports that he quit smoking about 41 years ago. He has never used smokeless tobacco. He reports that he drinks about 3.6 ounces of alcohol per week. He reports that he does not use illicit drugs.     Review of Systems: Pertinent positive and negative review of systems were noted in the above HPI section. All other review of systems were otherwise negative.  Vital signs were reviewed in today's medical record Physical Exam: General: Elderly male  in no acute distress Skin: anicteric Head: Normocephalic and atraumatic Eyes:  sclerae anicteric, EOMI Ears: Normal auditory acuity Mouth: No deformity or lesions Neck: Supple, no masses or thyromegaly Lungs: Clear throughout to auscultation Heart: Regular rate and rhythm; no rubs or bruits Abdomen: Soft, non tender and non distended. No masses, hepatosplenomegaly or hernias noted. Normal Bowel sounds.  There is a 3/6 holosystolic murmur Rectal:deferred Musculoskeletal: Symmetrical with no gross deformities  Skin: No lesions on visible extremities Pulses:  Normal pulses noted Extremities: No clubbing, cyanosis,  or deformities noted.  There is 2+ ankle edema Neurological: Alert oriented x 4, grossly nonfocal Cervical Nodes:  No significant cervical adenopathy Inguinal Nodes: No significant inguinal adenopathy Psychological:  Alert and cooperative. Normal mood and affect  See Assessment and Plan under Problem List

## 2013-09-22 NOTE — Assessment & Plan Note (Signed)
Changes on CT scan are consistent with chronic calcific pancreatitis, very likely to 2 alcohol abuse.  Surprisingly, patient has no history of overt pancreatitis except for one episode associated with gallstones.  He currently is asymptomatic.  Pancreatic neoplasm is less likely.  Repeat on  Recommendations #1 CBC, comprehensive metabolic profile, amylase, CA [phone]

## 2013-09-22 NOTE — Patient Instructions (Signed)
Go to the basement for labs today 

## 2013-09-23 LAB — CANCER ANTIGEN 19-9: CA 19 9: 87.2 U/mL — AB (ref <35.0)

## 2013-09-24 ENCOUNTER — Telehealth: Payer: Self-pay | Admitting: Gastroenterology

## 2013-09-24 NOTE — Telephone Encounter (Signed)
Advised her we will need a Administrator, Civil ServiceDesignated Party Release completed in [name]'s chart  before we can release any information to her. Verbalized understanding.

## 2013-09-27 ENCOUNTER — Encounter: Payer: Self-pay | Admitting: Gastroenterology

## 2013-09-30 ENCOUNTER — Encounter: Payer: Self-pay | Admitting: *Deleted

## 2013-11-16 DEATH — deceased

## 2014-02-24 ENCOUNTER — Encounter (HOSPITAL_COMMUNITY): Payer: Self-pay | Admitting: Cardiology

## 2015-01-05 ENCOUNTER — Encounter: Payer: Self-pay | Admitting: Gastroenterology

## 2015-01-11 IMAGING — CT CT ABD-PELV W/ CM
2 of 5 series · 16 of 46 positions shown, 18 images · IV contrast (READICAT/WATER & [ID] OMNI 300)
Comparison: Noncontrast CT abdomen and pelvis 08/22/2008 dx

CLINICAL DATA: ABD PAIN/CHILLS

EXAM:
CT ABDOMEN AND PELVIS WITH CONTRAST
TECHNIQUE: Multidetector CT imaging of the abdomen and pelvis was performed
using the standard protocol following bolus administration of
intravenous contrast.
CONTRAST:  100mL OMNIPAQUE IOHEXOL 300 MG/ML  SOLN

[Series 2: abd/pelvis with · axial · 0.75mm/px · z∈[-412,-27]mm · 13 of 87 slices shown, 15 images]
[im 5/87  soft-tissue]
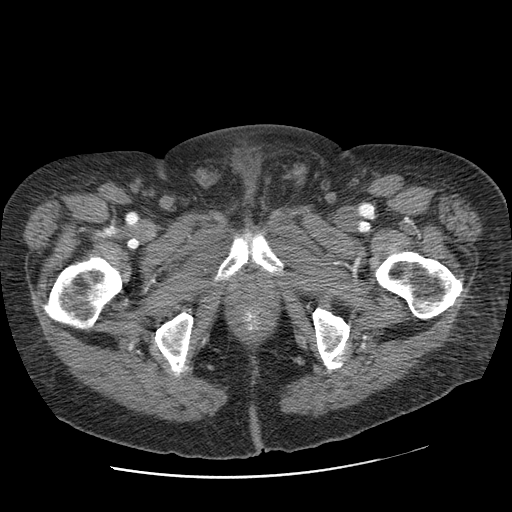
[im 5/87  bone]
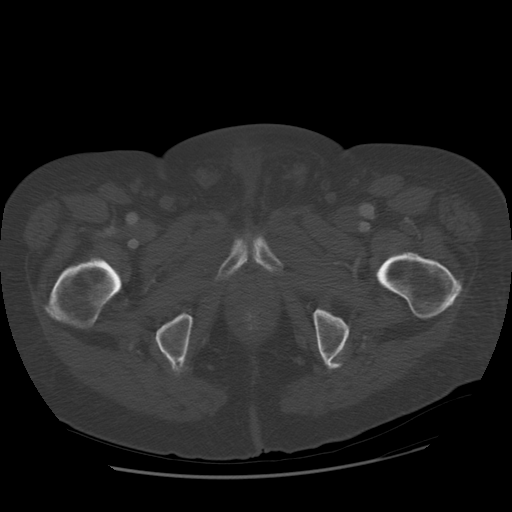
[im 10/87  soft-tissue]
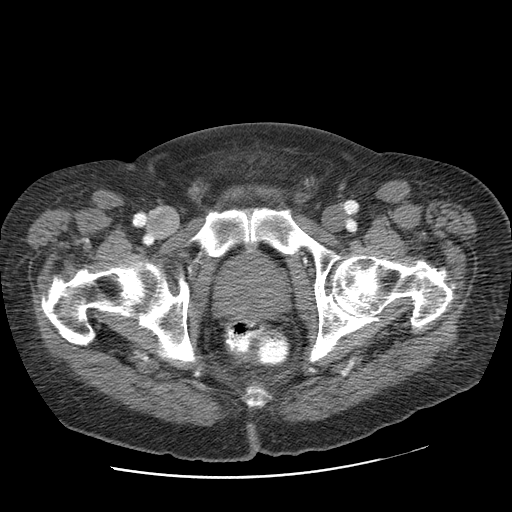
[im 20/87  soft-tissue]
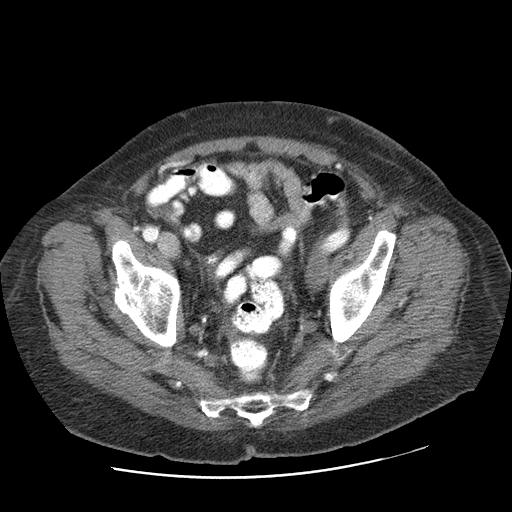
[im 24/87  soft-tissue]
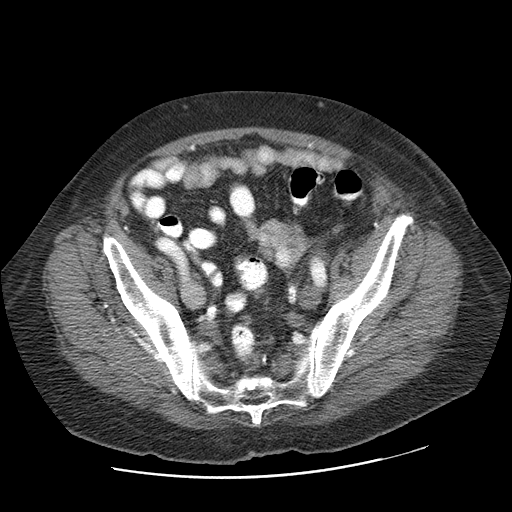
[im 29/87  soft-tissue]
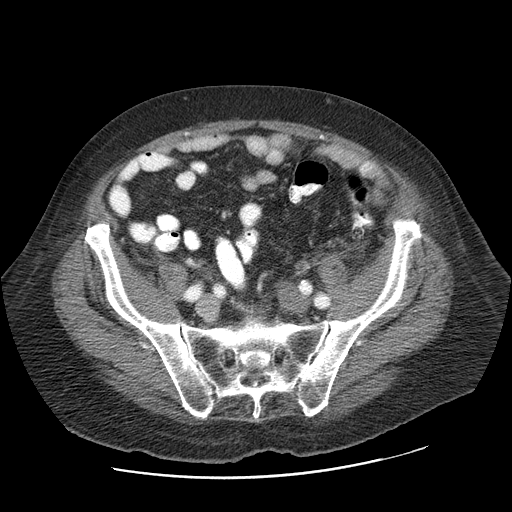
[im 39/87  soft-tissue]
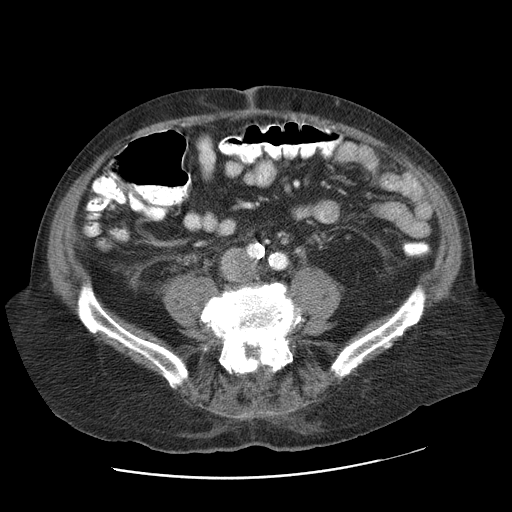
[im 44/87  soft-tissue]
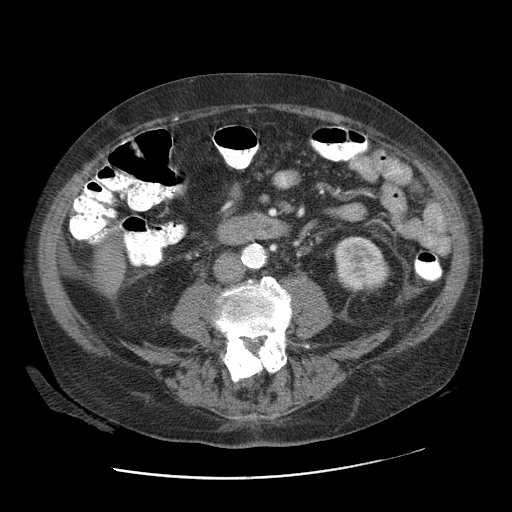
[im 48/87  soft-tissue]
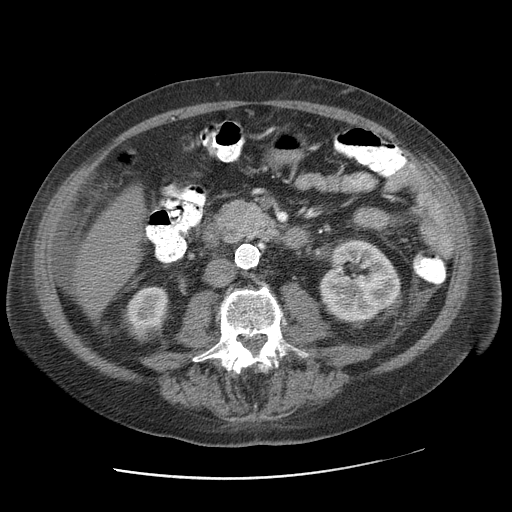
[im 58/87  soft-tissue]
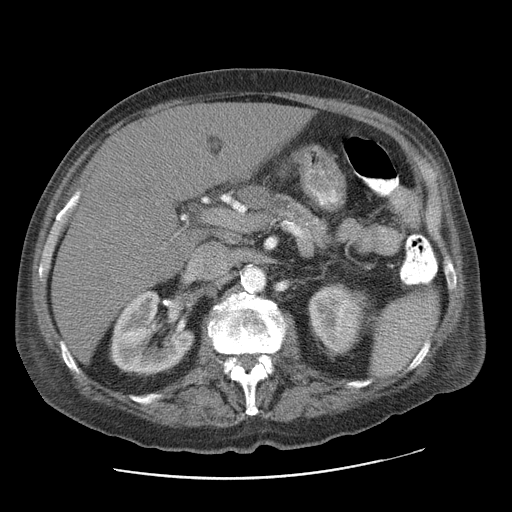
[im 58/87  bone]
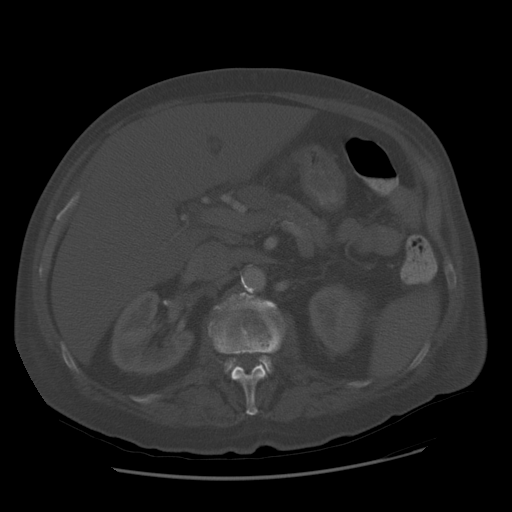
[im 63/87  soft-tissue]
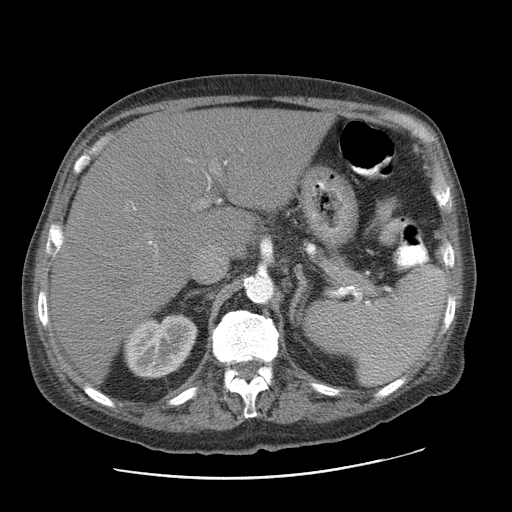
[im 67/87  soft-tissue]
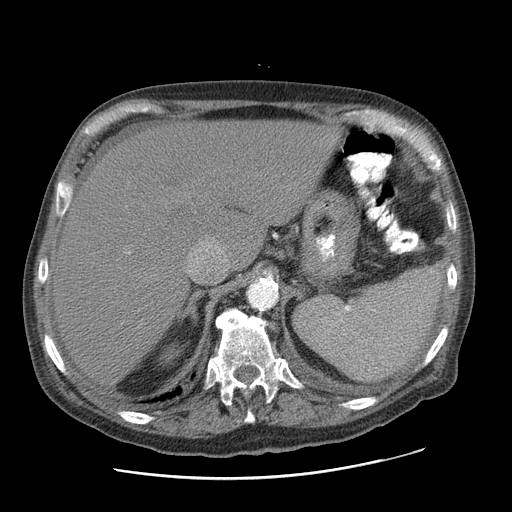
[im 77/87  soft-tissue]
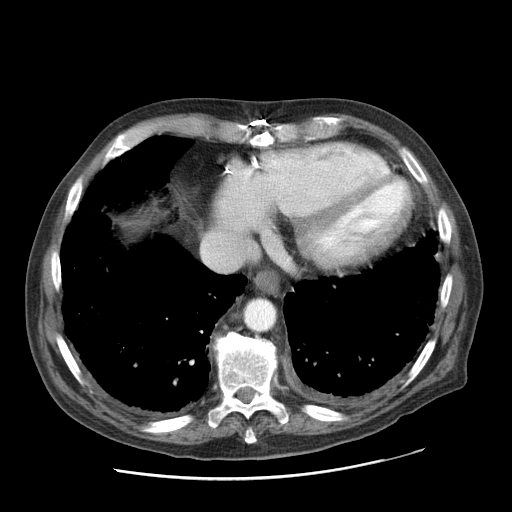
[im 82/87  soft-tissue]
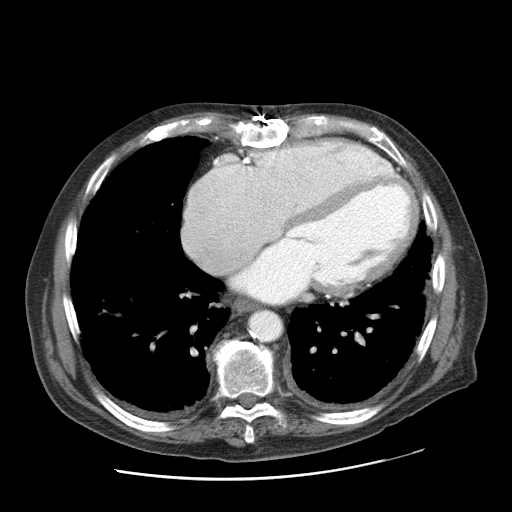

[Series 400: cor · coronal · 0.93mm/px · 3 of 150 slices shown]
[im 50/150  soft-tissue]
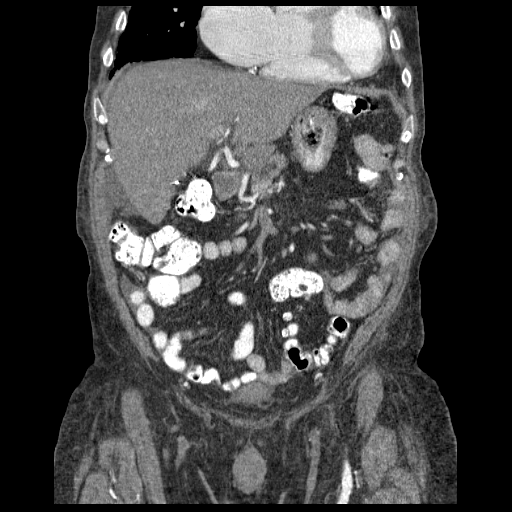
[im 67/150  soft-tissue]
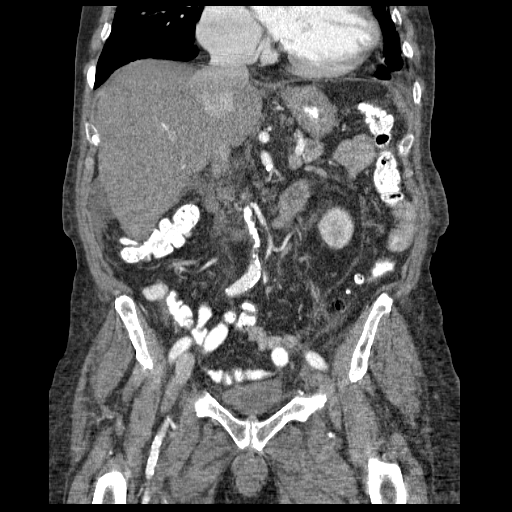
[im 83/150  soft-tissue]
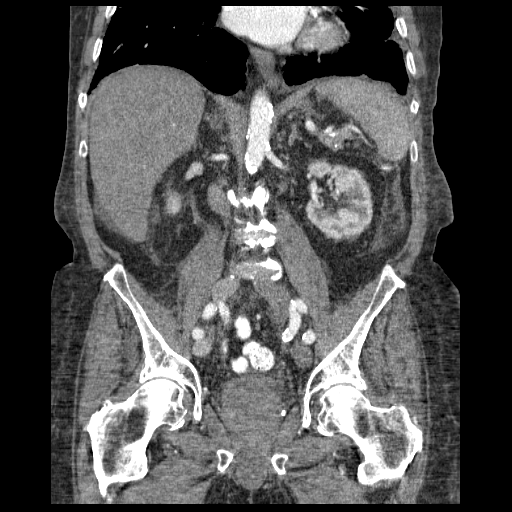

[16 of 46 positions shown; findings below may reference images not displayed]

FINDINGS: There is interlobular septal thickening, and subpleural reticulation
within the lung bases. Stable areas of pleural thickening are also
appreciated. No focal regions of consolidation or focal infiltrates.

There is a very small amount of perihepatic ascites. The liver
otherwise appears unremarkable. A trace amount of perisplenic
ascites. Spleen otherwise unremarkable. The adrenals, and kidneys
are unremarkable.

There is diffuse dilation of the pancreatic duct and punctate
dystrophic appearing calcifications within the pancreas. There is no
appreciable peripancreatic fluid nor pancreatic inflammatory change.
There is loss of the normal undulating border which may represent a
component of edema.

Trace amount of ascites is appreciated within the abdomen and
pelvis. No loculated fluid collections are appreciated.

No abdominal or pelvic mass is nor adenopathy.

The bowel is negative. Patient is status post cholecystectomy. The
appendix is not clearly appreciated though there no secondary signs
raising concern of appendicitis. No abdominal wall no inguinal
hernia.

There are no aggressive appearing osseous lesions. Multilevel
spondylosis.
IMPRESSION: Changes within the pancreas consistent chronic pancreatitis, and
findings which may represent mild edema. Nonspecific mild
inflammatory changes are appreciated within the abdomen and pelvis.
Correlation with pancreatic function tests recommended early or mild
pancreatitis is a diagnostic consideration. Other indolent
inflammatory or infectious etiologies cannot be excluded. As
clinically indicated.

Interstitial fibrotic changes within the lung bases. Stable areas of
pleural thickening.

Cardiomegaly

Atherosclerotic calcifications within the coronary vessels, aorta,
and iliac vessels.
